# Patient Record
Sex: Female | Born: 1980 | Race: White | Hispanic: No | Marital: Married | Smoking: Never smoker
Health system: Southern US, Community
[De-identification: ages and names within clinical notes are randomized; demographics above are authoritative.]

## PROBLEM LIST (undated history)

## (undated) DIAGNOSIS — M7918 Myalgia, other site: Secondary | ICD-10-CM

## (undated) DIAGNOSIS — G43909 Migraine, unspecified, not intractable, without status migrainosus: Secondary | ICD-10-CM

## (undated) DIAGNOSIS — F419 Anxiety disorder, unspecified: Secondary | ICD-10-CM

## (undated) DIAGNOSIS — E538 Deficiency of other specified B group vitamins: Secondary | ICD-10-CM

## (undated) DIAGNOSIS — F988 Other specified behavioral and emotional disorders with onset usually occurring in childhood and adolescence: Secondary | ICD-10-CM

## (undated) DIAGNOSIS — G4700 Insomnia, unspecified: Secondary | ICD-10-CM

## (undated) HISTORY — PX: OTHER SURGICAL HISTORY: SHX169

## (undated) HISTORY — DX: Other specified behavioral and emotional disorders with onset usually occurring in childhood and adolescence: F98.8

## (undated) HISTORY — DX: Myalgia, other site: M79.18

## (undated) HISTORY — DX: Migraine, unspecified, not intractable, without status migrainosus: G43.909

## (undated) HISTORY — DX: Deficiency of other specified B group vitamins: E53.8

## (undated) HISTORY — DX: Anxiety disorder, unspecified: F41.9

## (undated) HISTORY — PX: DILATION AND CURETTAGE OF UTERUS: SHX78

## (undated) HISTORY — PX: WISDOM TOOTH EXTRACTION: SHX21

## (undated) HISTORY — DX: Insomnia, unspecified: G47.00

---
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
## 1999-05-12 ENCOUNTER — Inpatient Hospital Stay (HOSPITAL_COMMUNITY): Admission: AD | Admit: 1999-05-12 | Discharge: 1999-05-12 | Payer: Self-pay | Admitting: Obstetrics and Gynecology

## 1999-08-14 ENCOUNTER — Other Ambulatory Visit: Admission: RE | Admit: 1999-08-14 | Discharge: 1999-08-14 | Payer: Self-pay | Admitting: Obstetrics & Gynecology

## 2002-03-24 ENCOUNTER — Inpatient Hospital Stay (HOSPITAL_COMMUNITY): Admission: AD | Admit: 2002-03-24 | Discharge: 2002-03-24 | Payer: Self-pay | Admitting: Obstetrics & Gynecology

## 2009-04-28 HISTORY — PX: APPENDECTOMY: SHX54

## 2014-07-03 ENCOUNTER — Other Ambulatory Visit: Payer: Self-pay | Admitting: Family Medicine

## 2014-07-03 DIAGNOSIS — R599 Enlarged lymph nodes, unspecified: Secondary | ICD-10-CM

## 2014-07-26 ENCOUNTER — Ambulatory Visit
Admission: RE | Admit: 2014-07-26 | Discharge: 2014-07-26 | Disposition: A | Discharge status: Home / Self Care | Payer: 59 | Source: Ambulatory Visit | Attending: Family Medicine | Admitting: Family Medicine

## 2014-07-26 DIAGNOSIS — R599 Enlarged lymph nodes, unspecified: Secondary | ICD-10-CM

## 2014-08-04 ENCOUNTER — Encounter: Payer: Self-pay | Admitting: Neurology

## 2014-08-07 ENCOUNTER — Telehealth: Payer: Self-pay | Admitting: *Deleted

## 2014-08-07 NOTE — Telephone Encounter (Signed)
Patient canceled her new patient appointment for 4-12. She will reschedule soon. Patients referring provider notified (bobbi)

## 2014-08-08 ENCOUNTER — Ambulatory Visit: Payer: Self-pay | Admitting: Neurology

## 2016-02-05 ENCOUNTER — Other Ambulatory Visit (HOSPITAL_COMMUNITY)
Admission: RE | Admit: 2016-02-05 | Discharge: 2016-02-05 | Disposition: A | Discharge status: Home / Self Care | Payer: BLUE CROSS/BLUE SHIELD | Source: Ambulatory Visit | Attending: Obstetrics and Gynecology | Admitting: Obstetrics and Gynecology

## 2016-02-05 ENCOUNTER — Other Ambulatory Visit: Payer: Self-pay | Admitting: Obstetrics and Gynecology

## 2016-02-05 DIAGNOSIS — Z01419 Encounter for gynecological examination (general) (routine) without abnormal findings: Secondary | ICD-10-CM | POA: Insufficient documentation

## 2016-02-05 DIAGNOSIS — Z1151 Encounter for screening for human papillomavirus (HPV): Secondary | ICD-10-CM | POA: Insufficient documentation

## 2016-02-06 LAB — CYTOLOGY - PAP

## 2019-05-04 ENCOUNTER — Encounter (HOSPITAL_BASED_OUTPATIENT_CLINIC_OR_DEPARTMENT_OTHER): Payer: Self-pay | Admitting: *Deleted

## 2019-05-04 ENCOUNTER — Other Ambulatory Visit: Payer: Self-pay

## 2019-05-04 ENCOUNTER — Emergency Department (HOSPITAL_BASED_OUTPATIENT_CLINIC_OR_DEPARTMENT_OTHER)
Admission: EM | Admit: 2019-05-04 | Discharge: 2019-05-04 | Disposition: A | Discharge status: Home / Self Care | Payer: 59 | Attending: Emergency Medicine | Admitting: Emergency Medicine

## 2019-05-04 ENCOUNTER — Emergency Department (HOSPITAL_BASED_OUTPATIENT_CLINIC_OR_DEPARTMENT_OTHER): Payer: 59

## 2019-05-04 DIAGNOSIS — K5900 Constipation, unspecified: Secondary | ICD-10-CM | POA: Insufficient documentation

## 2019-05-04 DIAGNOSIS — Z5321 Procedure and treatment not carried out due to patient leaving prior to being seen by health care provider: Secondary | ICD-10-CM | POA: Diagnosis not present

## 2019-05-04 LAB — URINALYSIS, ROUTINE W REFLEX MICROSCOPIC
Bilirubin Urine: NEGATIVE
Glucose, UA: 100 mg/dL — AB
Ketones, ur: NEGATIVE mg/dL
Leukocytes,Ua: NEGATIVE
Nitrite: NEGATIVE
Protein, ur: NEGATIVE mg/dL
Specific Gravity, Urine: >1.03 — ABNORMAL HIGH (ref 1.005–1.030)
pH: 5.5 (ref 5.0–8.0)

## 2019-05-04 LAB — URINALYSIS, MICROSCOPIC (REFLEX)

## 2019-05-04 LAB — PREGNANCY, URINE: Preg Test, Ur: NEGATIVE

## 2019-05-04 MED ORDER — MAGNESIUM CITRATE PO SOLN
1.0000 | Freq: Once | ORAL | Status: AC
  Administered 2019-05-04: 1 via ORAL

## 2019-05-04 MED ORDER — MAGNESIUM CITRATE PO SOLN
ORAL | Status: AC
  Filled 2019-05-04: qty 296

## 2019-05-04 NOTE — ED Triage Notes (Addendum)
Pt c/o constipation x 3 days, taken oxi for wisdom teeth removal

## 2019-05-04 NOTE — ED Notes (Signed)
Pt reports having BM after medication. Pt requesting to leave.

## 2019-11-01 ENCOUNTER — Other Ambulatory Visit: Payer: Self-pay | Admitting: Obstetrics and Gynecology

## 2019-11-01 DIAGNOSIS — Z1231 Encounter for screening mammogram for malignant neoplasm of breast: Secondary | ICD-10-CM

## 2019-11-04 ENCOUNTER — Ambulatory Visit: Payer: 59

## 2020-03-12 ENCOUNTER — Ambulatory Visit: Payer: 59 | Admitting: Diagnostic Neuroimaging

## 2020-03-19 ENCOUNTER — Ambulatory Visit: Payer: 59 | Admitting: Neurology

## 2020-06-14 ENCOUNTER — Encounter: Payer: Self-pay | Admitting: *Deleted

## 2020-06-14 ENCOUNTER — Telehealth: Payer: Self-pay | Admitting: Neurology

## 2020-06-14 ENCOUNTER — Encounter: Payer: Self-pay | Admitting: Neurology

## 2020-06-14 ENCOUNTER — Ambulatory Visit (INDEPENDENT_AMBULATORY_CARE_PROVIDER_SITE_OTHER): Payer: 59 | Admitting: Neurology

## 2020-06-14 VITALS — BP 110/73 | HR 88 | Ht 68.0 in | Wt 117.0 lb

## 2020-06-14 DIAGNOSIS — G43711 Chronic migraine without aura, intractable, with status migrainosus: Secondary | ICD-10-CM | POA: Diagnosis not present

## 2020-06-14 DIAGNOSIS — H539 Unspecified visual disturbance: Secondary | ICD-10-CM

## 2020-06-14 DIAGNOSIS — R519 Headache, unspecified: Secondary | ICD-10-CM | POA: Diagnosis not present

## 2020-06-14 DIAGNOSIS — G8929 Other chronic pain: Secondary | ICD-10-CM

## 2020-06-14 DIAGNOSIS — R51 Headache with orthostatic component, not elsewhere classified: Secondary | ICD-10-CM | POA: Diagnosis not present

## 2020-06-14 MED ORDER — ATOGEPANT 60 MG PO TABS: 60.0000 mg | ORAL_TABLET | Freq: Every day | ORAL | 6 refills | Status: DC

## 2020-06-14 NOTE — Telephone Encounter (Signed)
Botox charge sheet completed and is pending MD signature. Dx code G43.711 

## 2020-06-14 NOTE — Patient Instructions (Addendum)
Start Qulipta preventative Initiate botox - will call  Cephaly trial - return in a month or so Nerivio - trial - will call Continue Bernita Raisin - will resend prescrip for 16 MRI brain w/wo contrast - will call Car glass tinting - will have to send to you F/u 3 months

## 2020-06-14 NOTE — Telephone Encounter (Signed)
Patient is transferring from the [facility] to our clinic. She is due for botox. I told her we would look into how fast we could get the botox auth changed to our practice. If it will take more than several weeks I can also use samples to get her by. Please let me know thanks

## 2020-06-14 NOTE — Progress Notes (Signed)
For headache management pt has tried Sumatriptan (side effects), Nurtec (did not help at all), Rizatriptan, Baclofen, Zomig, Gabapentin ("horrible medication"), Amitriptyline, Topamax, Paxil, Ajovy, Red light therapy, cupping, trigger point injections, massage (helps), Tylenol, Wellbutrin, has not tried beta blockers because her blood pressure was low.

## 2020-06-14 NOTE — Progress Notes (Signed)
[facility]    Provider:  Dr Lucia GaskinsAhern Requesting Provider: Farris HasMorrow, Aaron, MD Primary Care Provider:  Farris HasMorrow, Aaron, MD  CC:  migraines  HPI:  Emma Miller is a 40 y.o. female here as requested by Farris HasMorrow, Aaron, MD for migraines. PMHx migraine headaches, B12 deficiency, ADD, insomnia, anxiety.  I reviewed Dr. Felicity PellegriniMoreau's notes: Patient appears to be in rizatriptan and Bernita RaisinUbrelvy which are medications that are used in migraine management, also daily B12 (states not taking currently).  She [place] [facility] [place] and [facility]  Dr. Felicity PellegriniMoreau's examination which included head, eyes, neck, lungs, heart and, psych were all normal.  She has had them since HS, 20-25 days a month of migraines, she has 2 different kinds of migraines, she can get pain on the left side of the head in the temple area, and under her eye, she can wake up with it, waxes and wanes, worse aftre lunch and before bed, it is always throbbing and always there, she will only have a few days of no headaches a month, she has tried watching her food and her environmental triggers. She also has other pains her neck her back and her hand on the left, they hurt. She just wants to put pressure on the spots her whole left arm and left side of her neck. Also light/sound sensitivity, pulsating/pounding/throbbing, nausea but no vomiting, a heating pad on the left side of her head and muscles help. Worse positionally and vision changes, can wake with headaches.  Lights and sirens always bother, she has motion sensitivity and doesn't ride. She even moved recently because of the traffic. She gets a 50% improvement in severity with her migraines but not frequency. No other focal neurologic deficits, associated symptoms, inciting events or modifiable factors.   Reviewed following report from prior notes, [facility]:  Current and past  medications: ANALGESICS:aspirin, Excedrin, tylenol ANTI-MIGRAINE:imitrex (SE: malaise), Maxalt, zomig HEART/BP: metoprolol DECONGESTANT/ANTIHISTAMINE: allegra, benadryl, Claritin, Flonase, Nasonex, sudafed, zyrtec ANTI-NAUSEANT NSAIDS:ibuprofen, naproxen MUSCLE RELAXANTS: tizanidine ANTI-CONVULSANTS: topamax STEROIDS: SLEEPING PILLS/TRANQUILIZERS: Ambien, tylenol pm ANTI-DEPRESSANTS: Wellbutrin, Zoloft, amitriptyline, nortriptyline, paxil HERBAL: Magnesium FIBROMYALGIA:  HORMONAL: OTHER: Ajovy,  PROCEDURES FOR HEADACHES: Botox    Reviewed notes, labs and imaging from outside physicians, which showed: see above  Review of Systems: Patient complains of symptoms per HPI as well as the following symptoms: headaches. Pertinent negatives and positives per HPI. All others negative.   Social History   Socioeconomic History  . Marital status: Married    Spouse name: Not on file  . Number of children: 1  . Years of education: 1 yr beauty school  . Highest education level: Some college, no degree  Occupational History  . Not on file  Tobacco Use  . Smoking status: Never Smoker  . Smokeless tobacco: Never Used  Vaping Use  . Vaping Use: Never used  Substance and Sexual Activity  . Alcohol use: No    Alcohol/week: 0.0 standard drinks  . Drug use: No  . Sexual activity: Not on file  Other Topics Concern  . Not on file  Social History Narrative   Lives at home with spouse and child   Right handed   Caffeine: 1 cup/day   Social Determinants of Health   Financial Resource Strain: Not on file  Food Insecurity: Not on file  Transportation Needs: Not on file  Physical Activity: Not on file  Stress: Not on file  Social Connections:  Not on file  Intimate Partner Violence: Not on file    Family History  Problem Relation Age of Onset  . Diabetes Father   . Cervical cancer Mother   . Migraines Mother        had TMJ surgery which resolved them  . Migraines Daughter      Past Medical History:  Diagnosis Date  . ADD (attention deficit disorder)   . Anxiety   . B12 deficiency   . Insomnia   . Migraine   . Myofascial pain syndrome     Patient Active Problem List   Diagnosis Date Noted  . Chronic migraine without aura, with intractable migraine, so stated, with status migrainosus 06/16/2020    Past Surgical History:  Procedure Laterality Date  . APPENDECTOMY  2011  . BARTHOLIN GLAND CYST    . DILATION AND CURETTAGE OF UTERUS    . WISDOM TOOTH EXTRACTION      Current Outpatient Medications  Medication Sig Dispense Refill  . ADDERALL XR 30 MG 24 hr capsule Take 30 mg by mouth every morning.  0  . amphetamine-dextroamphetamine (ADDERALL) 20 MG tablet Take 20 mg by mouth. afternoon    . Atogepant 60 MG TABS Take 60 mg by mouth daily. 30 tablet 6  . baclofen (LIORESAL) 10 MG tablet Take 10 mg by mouth at bedtime as needed.    . Cyanocobalamin (VITAMIN B-12 PO) Take by mouth.    . fexofenadine (ALLEGRA) 180 MG tablet Take 180 mg by mouth daily.    . fluticasone (FLONASE) 50 MCG/ACT nasal spray Place into the nose.    . norethindrone-ethinyl estradiol (JUNEL FE,GILDESS FE,LOESTRIN FE) 1-20 MG-MCG tablet Take 1 tablet by mouth.    . OnabotulinumtoxinA (BOTOX IJ) Inject as directed every 3 (three) months.    . rizatriptan (MAXALT) 10 MG tablet Take by mouth.    . UBRELVY 100 MG TABS SMARTSIG:1 Tablet(s) By Mouth Every 2 Hours PRN    . Ubrogepant (UBRELVY) 100 MG TABS Take 100 mg by mouth every 2 (two) hours as needed. Maximum 200mg  a day. 16 tablet 11  . zolpidem (AMBIEN) 5 MG tablet Take 5 mg by mouth at bedtime.    . SUMAtriptan (IMITREX) 100 MG tablet TAKE 1 TABLET (100 MG TOTAL) BY MOUTH EVERY 2 (TWO) HOURS AS NEEDED FOR MIGRAINE. (Patient not taking: Reported on 06/14/2020)     No current facility-administered medications for this visit.    Allergies as of 06/14/2020 - Review Complete 06/14/2020  Allergen Reaction Noted  . Doxycycline Other  (See Comments) 08/04/2014  . Nsaids Swelling 06/14/2020    Vitals: BP 110/73 (BP Location: Left Arm, Patient Position: Sitting)   Pulse 88   Ht 5\' 8"  (1.727 m)   Wt 117 lb (53.1 kg)   BMI 17.79 kg/m  Last Weight:  Wt Readings from Last 1 Encounters:  06/14/20 117 lb (53.1 kg)   Last Height:   Ht Readings from Last 1 Encounters:  06/14/20 5\' 8"  (1.727 m)     Physical exam: Exam: Gen: NAD, conversant, well nourised, well groomed                     CV: RRR, no MRG. No Carotid Bruits. No peripheral edema, warm, nontender Eyes: Conjunctivae clear without exudates or hemorrhage  Neuro: Detailed Neurologic Exam  Speech:    Speech is normal; fluent and spontaneous with normal comprehension.  Cognition:    The patient is oriented to person, place, and  time;     recent and remote memory intact;     language fluent;     normal attention, concentration,     fund of knowledge Cranial Nerves:    The pupils are equal, round, and reactive to light. The fundi are normal and spontaneous venous pulsations are present. Visual fields are full to finger confrontation. Extraocular movements are intact. Trigeminal sensation is intact and the muscles of mastication are normal. The face is symmetric. The palate elevates in the midline. Hearing intact. Voice is normal. Shoulder shrug is normal. The tongue has normal motion without fasciculations.   Coordination:  no dysmetria or ataxia  Gait:    Normal native gait  Motor Observation:    No asymmetry, no atrophy, and no involuntary movements noted. Tone:    Normal muscle tone.    Posture:    Posture is normal. normal erect    Strength:    Strength is V/V in the upper and lower limbs.      Sensation: intact to LT     Reflex Exam:  DTR's:    Deep tendon reflexes in the upper and lower extremities are normal bilaterally.   Toes:    The toes are downgoing bilaterally.   Clonus:    Clonus is absent.    Assessment/Plan:  Patient  with chronic intractable headaches and migraines. She has failed multiple medications. Botox providing >50% relief in severity but still having daily and for the most part continuous headaches with concerning symptoms needs a thorough evaluation.  - Start Qulipta preventative - Initiate botox(she has been getting it from [place], due now) - Not in the cervical paraspinals. Add more at left temple more where the migraines are the worst - Cephaly trial, let her borrow our cephaly, she will bring it back in 4 weeks - Nerivio - prescribe - Continue Bernita Raisin - will send in script for 16 - MRI brain: MRI brain due to concerning symptoms of morning headaches, positional headaches,vision changes  to look for space occupying mass, chiari or intracranial hypertension (pseudotumor). - Car glass tinting - will have to send to you   Orders Placed This Encounter  Procedures  . MR BRAIN W WO CONTRAST   Meds ordered this encounter  Medications  . Atogepant 60 MG TABS    Sig: Take 60 mg by mouth daily.    Dispense:  30 tablet    Refill:  6  . Ubrogepant (UBRELVY) 100 MG TABS    Sig: Take 100 mg by mouth every 2 (two) hours as needed. Maximum 200mg  a day.    Dispense:  16 tablet    Refill:  11   Discussed: To prevent or relieve headaches, try the following: Cool Compress. Lie down and place a cool compress on your head.  Avoid headache triggers. If certain foods or odors seem to have triggered your migraines in the past, avoid them. A headache diary might help you identify triggers.  Include physical activity in your daily routine. Try a daily walk or other moderate aerobic exercise.  Manage stress. Find healthy ways to cope with the stressors, such as delegating tasks on your to-do list.  Practice relaxation techniques. Try deep breathing, yoga, massage and visualization.  Eat regularly. Eating regularly scheduled meals and maintaining a healthy diet might help prevent headaches. Also, drink plenty of  fluids.  Follow a regular sleep schedule. Sleep deprivation might contribute to headaches Consider biofeedback. With this mind-body technique, you learn to control certain bodily functions --  such as muscle tension, heart rate and blood pressure -- to prevent headaches or reduce headache pain.    Proceed to emergency room if you experience new or worsening symptoms or symptoms do not resolve, if you have new neurologic symptoms or if headache is severe, or for any concerning symptom.   Provided education and documentation from American headache Society toolbox including articles on: chronic migraine medication overuse headache, chronic migraines, prevention of migraines, behavioral and other nonpharmacologic treatments for headache.   Cc: Farris Has, MD,  Farris Has, MD  Naomie Dean, MD  [facility] [address] [place], [place] [address]  Phone [phone] Fax [phone]

## 2020-06-16 ENCOUNTER — Encounter: Payer: Self-pay | Admitting: Neurology

## 2020-06-16 DIAGNOSIS — G43711 Chronic migraine without aura, intractable, with status migrainosus: Secondary | ICD-10-CM | POA: Insufficient documentation

## 2020-06-16 MED ORDER — UBRELVY 100 MG PO TABS: 100.0000 mg | ORAL_TABLET | ORAL | 11 refills | Status: DC | PRN

## 2020-06-18 NOTE — Telephone Encounter (Signed)
Let's set her up for next week if we have any openings? I can always use samples if needed.

## 2020-06-18 NOTE — Telephone Encounter (Signed)
Filled out [facility] authorization request form for Botox to be administered at our office. Faxed with notes. I should receive an approval this week, but if I don't I will let you know.

## 2020-06-19 ENCOUNTER — Telehealth: Payer: Self-pay | Admitting: Neurology

## 2020-06-19 ENCOUNTER — Telehealth: Payer: Self-pay | Admitting: *Deleted

## 2020-06-19 NOTE — Telephone Encounter (Signed)
Received message via fax from [place]. Patient has qualified to receive Qulipta at no cost, through the [place] complete program at this time. They will reach out to patient and will schedule delivery.

## 2020-06-19 NOTE — Telephone Encounter (Signed)
[facility]: [id number] (exp. 06/19/20 to 07/18/20) order sent to [facility]. [facility] is not in network for the patient MRI they will reach out to the patient to schedule.

## 2020-06-19 NOTE — Telephone Encounter (Signed)
How about Monday at 330?

## 2020-06-19 NOTE — Telephone Encounter (Signed)
I called the patient this afternoon but was not able to get in touch with her or LVM. I will try again tomorrow.

## 2020-06-19 NOTE — Telephone Encounter (Signed)
I had to add it on the schedule but it is there and held.

## 2020-06-19 NOTE — Telephone Encounter (Signed)
We do not currently have any openings but I will help watch for one.

## 2020-06-19 NOTE — Telephone Encounter (Deleted)
Emma Miller we could do Monday 2/28 @ 330.

## 2020-06-19 NOTE — Telephone Encounter (Signed)
Bethany please block Monday at 330 so taylor can offer it to her, I'll try and send her an email as well thanks

## 2020-06-19 NOTE — Telephone Encounter (Signed)
Qulipta 60 mg order form completed, signed, and faxed to [place]. Received a receipt of confirmation.

## 2020-06-19 NOTE — Telephone Encounter (Signed)
Received approval from [facility]. PA #[id number] (06/18/20- 09/15/20). Servicing facility/provider changed to [facility]/Dr. Lucia Gaskins.

## 2020-06-20 NOTE — Telephone Encounter (Signed)
Absolutely, use samples thanks!

## 2020-06-20 NOTE — Telephone Encounter (Signed)
I called the patient. She is happy to come in Monday. I placed her on the schedule. Patient's insurance requires use of specialty pharmacy and I will work on getting that set up. It is okay to use samples in the meantime. [id number] is approved and ok to bill for.

## 2020-06-20 NOTE — Telephone Encounter (Signed)
Filled out prescription enrollment form for [facility]. Gave to MD to sign.

## 2020-06-21 NOTE — Telephone Encounter (Signed)
Received signed prescription form. Faxed to [facility].

## 2020-06-25 ENCOUNTER — Ambulatory Visit (INDEPENDENT_AMBULATORY_CARE_PROVIDER_SITE_OTHER): Payer: 59 | Admitting: Neurology

## 2020-06-25 ENCOUNTER — Other Ambulatory Visit: Payer: Self-pay

## 2020-06-25 DIAGNOSIS — G43711 Chronic migraine without aura, intractable, with status migrainosus: Secondary | ICD-10-CM

## 2020-06-25 NOTE — Progress Notes (Signed)
Botox consent signed  Botox- 200 units x 1 vial Lot: [id number] Expiration: 06/2022 NDC: 5631-4970-26  Bacteriostatic 0.9% Sodium Chloride- 63mL total Lot: [id number] Expiration: 05/29/2021 NDC: 5027-7412-87  Dx: G43.711 sample

## 2020-06-25 NOTE — Progress Notes (Signed)
Consent Form Botulism Toxin Injection For Chronic Migraine  06/25/2020: This is our first botox (she has been receiving it at another headache clinic). See pictures from this date for placement. +a. No Traps or cervical paraspinals, she feels it makes her neck weak. Extra right temporalis where migraines start.   I spent 30 minutes of face-to-face and non-face-to-face time with patient on the  1. Chronic migraine without aura, with intractable migraine, so stated, with status migrainosus    diagnosis.  This included previsit chart review, lab review, study review, order entry, electronic health record documentation, patient education on the different diagnostic and therapeutic options, counseling and coordination of care, risks and benefits of management, compliance, or risk factor reduction  Reviewed orally with patient, additionally signature is on file:  Botulism toxin has been approved by the Federal drug administration for treatment of chronic migraine. Botulism toxin does not cure chronic migraine and it may not be effective in some patients.  The administration of botulism toxin is accomplished by injecting a small amount of toxin into the muscles of the neck and head. Dosage must be titrated for each individual. Any benefits resulting from botulism toxin tend to wear off after 3 months with a repeat injection required if benefit is to be maintained. Injections are usually done every 3-4 months with maximum effect peak achieved by about 2 or 3 weeks. Botulism toxin is expensive and you should be sure of what costs you will incur resulting from the injection.  The side effects of botulism toxin use for chronic migraine may include:   -Transient, and usually mild, facial weakness with facial injections  -Transient, and usually mild, head or neck weakness with head/neck injections  -Reduction or loss of forehead facial animation due to forehead muscle weakness  -Eyelid drooping  -Dry  eye  -Pain at the site of injection or bruising at the site of injection  -Double vision  -Potential unknown long term risks  Contraindications: You should not have Botox if you are pregnant, nursing, allergic to albumin, have an infection, skin condition, or muscle weakness at the site of the injection, or have myasthenia gravis, Lambert-Eaton syndrome, or ALS.  It is also possible that as with any injection, there may be an allergic reaction or no effect from the medication. Reduced effectiveness after repeated injections is sometimes seen and rarely infection at the injection site may occur. All care will be taken to prevent these side effects. If therapy is given over a long time, atrophy and wasting in the muscle injected may occur. Occasionally the patient's become refractory to treatment because they develop antibodies to the toxin. In this event, therapy needs to be modified.  I have read the above information and consent to the administration of botulism toxin.    BOTOX PROCEDURE NOTE FOR MIGRAINE HEADACHE    Contraindications and precautions discussed with patient(above). Aseptic procedure was observed and patient tolerated procedure. Procedure performed by Dr. Artemio Aly  The condition has existed for more than 6 months, and pt does not have a diagnosis of ALS, Myasthenia Gravis or Lambert-Eaton Syndrome.  Risks and benefits of injections discussed and pt agrees to proceed with the procedure.  Written consent obtained  These injections are medically necessary. Pt  receives good benefits from these injections. These injections do not cause sedations or hallucinations which the oral therapies may cause.  Description of procedure:  The patient was placed in a sitting position. The standard protocol was used for Botox as follows,  with 5 units of Botox injected at each site:   -Procerus muscle, midline injection  -Corrugator muscle, bilateral injection  -Frontalis muscle,  bilateral injection, with 2 sites each side, medial injection was performed in the upper one third of the frontalis muscle, in the region vertical from the medial inferior edge of the superior orbital rim. The lateral injection was again in the upper one third of the forehead vertically above the lateral limbus of the cornea, 1.5 cm lateral to the medial injection site.  -Temporalis muscle injection, 4 sites, bilaterally. The first injection was 3 cm above the tragus of the ear, second injection site was 1.5 cm to 3 cm up from the first injection site in line with the tragus of the ear. The third injection site was 1.5-3 cm forward between the first 2 injection sites. The fourth injection site was 1.5 cm posterior to the second injection site.   -Occipitalis muscle injection, 3 sites, bilaterally. The first injection was done one half way between the occipital protuberance and the tip of the mastoid process behind the ear. The second injection site was done lateral and superior to the first, 1 fingerbreadth from the first injection. The third injection site was 1 fingerbreadth superiorly and medially from the first injection site.  -Cervical paraspinal muscle injection, 2 sites, bilateral knee first injection site was 1 cm from the midline of the cervical spine, 3 cm inferior to the lower border of the occipital protuberance. The second injection site was 1.5 cm superiorly and laterally to the first injection site.  -Trapezius muscle injection was performed at 3 sites, bilaterally. The first injection site was in the upper trapezius muscle halfway between the inflection point of the neck, and the acromion. The second injection site was one half way between the acromion and the first injection site. The third injection was done between the first injection site and the inflection point of the neck.   Will return for repeat injection in 3 months.   200 units of Botox was used, any Botox not injected was  wasted. The patient tolerated the procedure well, there were no complications of the above procedure.

## 2020-07-02 ENCOUNTER — Other Ambulatory Visit: Payer: Self-pay | Admitting: Neurology

## 2020-07-02 NOTE — Telephone Encounter (Signed)
Completed [employer] PA request for pharmacy benefit via CMM. Waiting for response.

## 2020-07-02 NOTE — Telephone Encounter (Signed)
I called the patient. Advised that for May and future Botox injections we will use [facility]. Advised patient that prescription was faxed 2/24 and they are processing prior authorization at this time. Advised patient that I will continue to check status and she will be notified when it is time to schedule Botox delivery to our office. Patient verbalized understanding.

## 2020-07-02 NOTE — Telephone Encounter (Signed)
I added that pharmacy to her list. In the future we can send there. If she calls her current [facility] they can transfer all the prescriptions for her thanks.

## 2020-07-04 ENCOUNTER — Telehealth: Payer: Self-pay | Admitting: *Deleted

## 2020-07-04 NOTE — Telephone Encounter (Signed)
Completed Nerivio order form and printed office note and insurance/demographic information. Order form is pending Dr Trevor Mace signature.

## 2020-07-04 NOTE — Telephone Encounter (Signed)
I called [facility] and spoke with Aundra Millet today to check status of Botox order. Aundra Millet states the order is ready but they need patient's consent. I called patient and let her know. I also advised patient of Dr. Trevor Mace message regarding switching pharmacies.

## 2020-07-04 NOTE — Telephone Encounter (Signed)
Nerivio order form signed by Dr Lucia Gaskins, faxed to [facility]. Received a receipt of confirmation.

## 2020-07-06 ENCOUNTER — Ambulatory Visit
Admission: RE | Admit: 2020-07-06 | Discharge: 2020-07-06 | Disposition: A | Discharge status: Home / Self Care | Payer: 59 | Source: Ambulatory Visit | Attending: Neurology | Admitting: Neurology

## 2020-07-06 DIAGNOSIS — H539 Unspecified visual disturbance: Secondary | ICD-10-CM

## 2020-07-06 DIAGNOSIS — R519 Headache, unspecified: Secondary | ICD-10-CM

## 2020-07-06 DIAGNOSIS — R51 Headache with orthostatic component, not elsewhere classified: Secondary | ICD-10-CM

## 2020-07-06 DIAGNOSIS — G43711 Chronic migraine without aura, intractable, with status migrainosus: Secondary | ICD-10-CM

## 2020-07-06 MED ORDER — GADOBENATE DIMEGLUMINE 529 MG/ML IV SOLN
10.0000 mL | Freq: Once | INTRAVENOUS | Status: AC | PRN
  Administered 2020-07-06: 10 mL via INTRAVENOUS

## 2020-07-09 NOTE — Telephone Encounter (Signed)
I called the specialty pharmacy this morning and spoke with Patrona to check order status. She states the patient has not yet given consent. I placed her on hold and attempted to call the patient. The patient was not available and VM is full.

## 2020-07-12 ENCOUNTER — Telehealth: Payer: Self-pay | Admitting: *Deleted

## 2020-07-12 NOTE — Telephone Encounter (Signed)
Bernita Raisin PA form signed by MD, then faxed to [employer]. Received a receipt of confirmation.

## 2020-07-12 NOTE — Telephone Encounter (Signed)
Received fax request for PA. PA filled out, awaiting MD signature and then will fax in for review

## 2020-08-06 NOTE — Telephone Encounter (Signed)
Received a call from [facility]. Botox TBD on 4/19 for 5/31 appointment.

## 2020-08-08 ENCOUNTER — Ambulatory Visit: Payer: 59

## 2020-08-09 ENCOUNTER — Inpatient Hospital Stay: Admission: RE | Admit: 2020-08-09 | Payer: 59 | Source: Ambulatory Visit

## 2020-08-14 ENCOUNTER — Other Ambulatory Visit: Payer: Self-pay | Admitting: Neurology

## 2020-08-14 NOTE — Telephone Encounter (Signed)
Rceived (1) 200 unit vial of Botox today from [facility].

## 2020-08-15 ENCOUNTER — Encounter: Payer: Self-pay | Admitting: *Deleted

## 2020-08-15 ENCOUNTER — Telehealth: Payer: Self-pay

## 2020-08-15 NOTE — Telephone Encounter (Signed)
I have submitted PA request for Ubrelvy 100mg  on [facility], [place]: [facility].  Awaiting determination from [employer]

## 2020-08-15 NOTE — Telephone Encounter (Signed)
Received fax stating Emma Miller has been approved from 08/15/2020 to 08/14/2021.  I faxed this notice to the patient's pharmacy. Received a receipt of confirmation.

## 2020-08-27 ENCOUNTER — Telehealth: Payer: Self-pay | Admitting: Neurology

## 2020-08-27 NOTE — Telephone Encounter (Signed)
Patient's next Botox appointment is 5/31. Her PA on file with [facility] will expire on 5/21. I filled out new PA form and faxed to [facility] with notes.

## 2020-09-13 NOTE — Telephone Encounter (Signed)
Received approval from [facility] for [id number] (09/15/20- 12/16/20)  Approval for [id number] is through [employer] (pharmacy benefit). PA #[id number] (07/03/20- 07/02/21). [employer] phone: [phone].

## 2020-09-25 ENCOUNTER — Ambulatory Visit (INDEPENDENT_AMBULATORY_CARE_PROVIDER_SITE_OTHER): Payer: 59 | Admitting: Neurology

## 2020-09-25 DIAGNOSIS — G43711 Chronic migraine without aura, intractable, with status migrainosus: Secondary | ICD-10-CM | POA: Diagnosis not present

## 2020-09-25 MED ORDER — REYVOW 100 MG PO TABS: 100.0000 mg | ORAL_TABLET | Freq: Once | ORAL | 0 refills | Status: AC | PRN

## 2020-09-25 MED ORDER — ZEMBRACE SYMTOUCH 3 MG/0.5ML ~~LOC~~ SOAJ: 3.0000 mg | Freq: Once | SUBCUTANEOUS | 0 refills | Status: AC | PRN

## 2020-09-25 MED ORDER — QULIPTA 60 MG PO TABS: 60.0000 mg | ORAL_TABLET | Freq: Every day | ORAL | 0 refills | Status: DC

## 2020-09-25 MED ORDER — TOSYMRA 10 MG/ACT NA SOLN: 1.0000 | NASAL | 0 refills | Status: AC

## 2020-09-25 NOTE — Progress Notes (Signed)
Botox- 100 units x 2 vials Lot: [id number] Expiration: 01/2023 NDC: 1423-9532-02  Bacteriostatic 0.9% Sodium Chloride- 29mL total Lot: [id number] Expiration: 09/26/2021 NDC: 8616-8372-90  Dx: S11.155 SP

## 2020-09-25 NOTE — Progress Notes (Addendum)
Consent Form Botulism Toxin Injection For Chronic Migraine 09/25/2020: second botox. She did not feel the masseters helped. Look at the pictures from this date.  No traps or cervical muscles. Also gace her som samples to try. Do not take triptans more than 2x a day or mix triptans. She is allergic to nsaids cannot try cambia. Try to get eletriptan approved.  Meds ordered this encounter  Medications  . Atogepant (QULIPTA) 60 MG TABS    Sig: Take 60 mg by mouth daily.    Dispense:  30 tablet    Refill:  0  . SUMAtriptan (TOSYMRA) 10 MG/ACT SOLN    Sig: Place 1 spray into the nose every 1 hour x 3 doses for 3 doses.    Dispense:  1 each    Refill:  0  . SUMAtriptan Succinate (ZEMBRACE SYMTOUCH) 3 MG/0.5ML SOAJ    Sig: Inject 3 mg into the skin once as needed for up to 1 dose. May repeat in 15 minutes. If symptoms persist, repeat in 2 hours. Max 4 injections daily.    Dispense:  0.5 mL    Refill:  0  . Lasmiditan Succinate (REYVOW) 100 MG TABS    Sig: Take 100 mg by mouth once as needed for up to 1 dose.    Dispense:  1 tablet    Refill:  0  . eletriptan (RELPAX) 40 MG tablet    Sig: Take 1 tablet (40 mg total) by mouth every 2 (two) hours as needed for migraine or headache. Maximum 2 doses in one day.    Dispense:  9 tablet    Refill:  11    06/25/2020: This is our first botox (she has been receiving it at another headache clinic). See pictures from this date for placement. +a. No Traps or cervical paraspinals, she feels it makes her neck weak. Extra right temporalis where migraines start.   I spent 30 minutes of face-to-face and non-face-to-face time with patient on the  1. Chronic migraine without aura, with intractable migraine, so stated, with status migrainosus    diagnosis.  This included previsit chart review, lab review, study review, order entry, electronic health record documentation, patient education on the different diagnostic and therapeutic options, counseling and  coordination of care, risks and benefits of management, compliance, or risk factor reduction  Reviewed orally with patient, additionally signature is on file:  Botulism toxin has been approved by the Federal drug administration for treatment of chronic migraine. Botulism toxin does not cure chronic migraine and it may not be effective in some patients.  The administration of botulism toxin is accomplished by injecting a small amount of toxin into the muscles of the neck and head. Dosage must be titrated for each individual. Any benefits resulting from botulism toxin tend to wear off after 3 months with a repeat injection required if benefit is to be maintained. Injections are usually done every 3-4 months with maximum effect peak achieved by about 2 or 3 weeks. Botulism toxin is expensive and you should be sure of what costs you will incur resulting from the injection.  The side effects of botulism toxin use for chronic migraine may include:   -Transient, and usually mild, facial weakness with facial injections  -Transient, and usually mild, head or neck weakness with head/neck injections  -Reduction or loss of forehead facial animation due to forehead muscle weakness  -Eyelid drooping  -Dry eye  -Pain at the site of injection or bruising at the site of  injection  -Double vision  -Potential unknown long term risks  Contraindications: You should not have Botox if you are pregnant, nursing, allergic to albumin, have an infection, skin condition, or muscle weakness at the site of the injection, or have myasthenia gravis, Lambert-Eaton syndrome, or ALS.  It is also possible that as with any injection, there may be an allergic reaction or no effect from the medication. Reduced effectiveness after repeated injections is sometimes seen and rarely infection at the injection site may occur. All care will be taken to prevent these side effects. If therapy is given over a long time, atrophy and wasting in  the muscle injected may occur. Occasionally the patient's become refractory to treatment because they develop antibodies to the toxin. In this event, therapy needs to be modified.  I have read the above information and consent to the administration of botulism toxin.    BOTOX PROCEDURE NOTE FOR MIGRAINE HEADACHE    Contraindications and precautions discussed with patient(above). Aseptic procedure was observed and patient tolerated procedure. Procedure performed by Dr. Artemio Aly  The condition has existed for more than 6 months, and pt does not have a diagnosis of ALS, Myasthenia Gravis or Lambert-Eaton Syndrome.  Risks and benefits of injections discussed and pt agrees to proceed with the procedure.  Written consent obtained  These injections are medically necessary. Pt  receives good benefits from these injections. These injections do not cause sedations or hallucinations which the oral therapies may cause.  Description of procedure:  The patient was placed in a sitting position. The standard protocol was used for Botox as follows, with 5 units of Botox injected at each site:   -Procerus muscle, midline injection  -Corrugator muscle, bilateral injection  -Frontalis muscle, bilateral injection, with 2 sites each side, medial injection was performed in the upper one third of the frontalis muscle, in the region vertical from the medial inferior edge of the superior orbital rim. The lateral injection was again in the upper one third of the forehead vertically above the lateral limbus of the cornea, 1.5 cm lateral to the medial injection site.  -Temporalis muscle injection, 4 sites, bilaterally. The first injection was 3 cm above the tragus of the ear, second injection site was 1.5 cm to 3 cm up from the first injection site in line with the tragus of the ear. The third injection site was 1.5-3 cm forward between the first 2 injection sites. The fourth injection site was 1.5 cm posterior to  the second injection site.   -Occipitalis muscle injection, 3 sites, bilaterally. The first injection was done one half way between the occipital protuberance and the tip of the mastoid process behind the ear. The second injection site was done lateral and superior to the first, 1 fingerbreadth from the first injection. The third injection site was 1 fingerbreadth superiorly and medially from the first injection site.  -Cervical paraspinal muscle injection, 2 sites, bilateral knee first injection site was 1 cm from the midline of the cervical spine, 3 cm inferior to the lower border of the occipital protuberance. The second injection site was 1.5 cm superiorly and laterally to the first injection site.  -Trapezius muscle injection was performed at 3 sites, bilaterally. The first injection site was in the upper trapezius muscle halfway between the inflection point of the neck, and the acromion. The second injection site was one half way between the acromion and the first injection site. The third injection was done between the first injection site  and the inflection point of the neck.   Will return for repeat injection in 3 months.   200 units of Botox was used, any Botox not injected was wasted. The patient tolerated the procedure well, there were no complications of the above procedure.

## 2020-09-26 ENCOUNTER — Ambulatory Visit: Payer: 59 | Admitting: Neurology

## 2020-09-26 MED ORDER — ELETRIPTAN HYDROBROMIDE 40 MG PO TABS: 40.0000 mg | ORAL_TABLET | ORAL | 11 refills | Status: DC | PRN

## 2020-09-26 NOTE — Addendum Note (Signed)
Addended by: Naomie Dean B on: 09/26/2020 05:45 PM   Modules accepted: Orders

## 2020-11-13 ENCOUNTER — Telehealth: Payer: Self-pay | Admitting: *Deleted

## 2020-11-13 NOTE — Telephone Encounter (Signed)
Received fax re: Bennie Pierini PA needed. [phone].

## 2020-11-14 ENCOUNTER — Telehealth: Payer: Self-pay | Admitting: *Deleted

## 2020-11-14 NOTE — Telephone Encounter (Signed)
Initiated PA for quilipta 60mg  po daily Key: [id number] - PA Case ID: [id number]. Determination pending from [employer].

## 2020-11-15 NOTE — Telephone Encounter (Signed)
Denied.  Fax confirmation received to [place] [phone].  [employer].

## 2020-11-21 ENCOUNTER — Telehealth: Payer: Self-pay | Admitting: Neurology

## 2020-11-21 NOTE — Telephone Encounter (Signed)
Derek Degroot @ [facility] is asking for a call from [place] re: the pa for pt he can be reached at [phone]

## 2020-11-21 NOTE — Telephone Encounter (Signed)
LMVM for Emma Miller, with qulipta that did send denial PA relating to pt , can redo again if needed.   He is to call back if needed.

## 2020-11-22 NOTE — Telephone Encounter (Signed)
Called Emma Miller with [employer].   For denials on qulipta it will require appeal (either by form or calling [facility] to let them know in process).  This was something that was always needed but not enforced, but they are now being more strict.  FYI.  Will need to be done for this pt.

## 2020-11-22 NOTE — Telephone Encounter (Signed)
Derek Degroot @ [facility] called needing to speak to [place], RN to discuss the pt. Please advise.

## 2020-11-27 ENCOUNTER — Encounter: Payer: Self-pay | Admitting: *Deleted

## 2020-12-04 NOTE — Telephone Encounter (Signed)
Appeal signed by Dr Lucia Gaskins, then faxed to [employer]. Received a receipt of confirmation.

## 2020-12-10 ENCOUNTER — Telehealth: Payer: Self-pay | Admitting: Neurology

## 2020-12-10 NOTE — Telephone Encounter (Signed)
Patient has a Botox appointment 8/30. PA through [facility] for CPT [id number] will expire 8/21. I filled out new PA request form and faxed to [facility] with notes.   Patient has PA on file for [phone] through pharmacy ([employer]). PA reference #[id number] (07/03/20- 07/02/21).  I called [facility] [place] I spoke with [facility] who states patient has not given consent for shipment.

## 2020-12-11 NOTE — Telephone Encounter (Signed)
Received approval from [facility] for CPT [phone]. PA #[id number] (12/16/20- 03/18/21).  I called patient and let her know to call the specialty pharmacy. Provided her with the number to call.

## 2020-12-12 NOTE — Telephone Encounter (Signed)
I called [facility] [phone]) and spoke with Marena Chancy. Botox TBD 8/23.

## 2020-12-18 NOTE — Telephone Encounter (Signed)
Received (1) 200 unit vial of Botox today from [facility].

## 2020-12-25 ENCOUNTER — Ambulatory Visit (INDEPENDENT_AMBULATORY_CARE_PROVIDER_SITE_OTHER): Payer: 59 | Admitting: Neurology

## 2020-12-25 DIAGNOSIS — G43711 Chronic migraine without aura, intractable, with status migrainosus: Secondary | ICD-10-CM

## 2020-12-25 NOTE — Progress Notes (Signed)
Botox- 200 units x 1 vial Lot: [id number] Expiration: 04/2023 NDC: 0023-3921-02  Bacteriostatic 0.9% Sodium Chloride- 4mL total Lot: [id number] Expiration: 04/28/2022 NDC: 0409-1966-02  Dx: G43.711 S/P  

## 2020-12-25 NOTE — Progress Notes (Signed)
Consent Form Botulism Toxin Injection For Chronic Migraine 12/25/2020: Stable doing great. Third botox. 09/25/2020: second botox. She did not feel the masseters helped. Look at the pictures from this date.  No traps or cervical muscles. Also gace her som samples to try. Do not take triptans more than 2x a day or mix triptans. She is allergic to nsaids cannot try cambia. Try to get eletriptan approved.  No orders of the defined types were placed in this encounter.   06/25/2020: This is our first botox (she has been receiving it at another headache clinic). See pictures from this date for placement. +a. No Traps or cervical paraspinals, she feels it makes her neck weak. Extra right temporalis where migraines start.   I spent 30 minutes of face-to-face and non-face-to-face time with patient on the  No diagnosis found.  diagnosis.  This included previsit chart review, lab review, study review, order entry, electronic health record documentation, patient education on the different diagnostic and therapeutic options, counseling and coordination of care, risks and benefits of management, compliance, or risk factor reduction  Reviewed orally with patient, additionally signature is on file:  Botulism toxin has been approved by the Federal drug administration for treatment of chronic migraine. Botulism toxin does not cure chronic migraine and it may not be effective in some patients.  The administration of botulism toxin is accomplished by injecting a small amount of toxin into the muscles of the neck and head. Dosage must be titrated for each individual. Any benefits resulting from botulism toxin tend to wear off after 3 months with a repeat injection required if benefit is to be maintained. Injections are usually done every 3-4 months with maximum effect peak achieved by about 2 or 3 weeks. Botulism toxin is expensive and you should be sure of what costs you will incur resulting from the injection.  The  side effects of botulism toxin use for chronic migraine may include:   -Transient, and usually mild, facial weakness with facial injections  -Transient, and usually mild, head or neck weakness with head/neck injections  -Reduction or loss of forehead facial animation due to forehead muscle weakness  -Eyelid drooping  -Dry eye  -Pain at the site of injection or bruising at the site of injection  -Double vision  -Potential unknown long term risks  Contraindications: You should not have Botox if you are pregnant, nursing, allergic to albumin, have an infection, skin condition, or muscle weakness at the site of the injection, or have myasthenia gravis, Lambert-Eaton syndrome, or ALS.  It is also possible that as with any injection, there may be an allergic reaction or no effect from the medication. Reduced effectiveness after repeated injections is sometimes seen and rarely infection at the injection site may occur. All care will be taken to prevent these side effects. If therapy is given over a long time, atrophy and wasting in the muscle injected may occur. Occasionally the patient's become refractory to treatment because they develop antibodies to the toxin. In this event, therapy needs to be modified.  I have read the above information and consent to the administration of botulism toxin.    BOTOX PROCEDURE NOTE FOR MIGRAINE HEADACHE    Contraindications and precautions discussed with patient(above). Aseptic procedure was observed and patient tolerated procedure. Procedure performed by Dr. Artemio Aly  The condition has existed for more than 6 months, and pt does not have a diagnosis of ALS, Myasthenia Gravis or Lambert-Eaton Syndrome.  Risks and benefits of injections discussed  and pt agrees to proceed with the procedure.  Written consent obtained  These injections are medically necessary. Pt  receives good benefits from these injections. These injections do not cause sedations or  hallucinations which the oral therapies may cause.  Description of procedure:  The patient was placed in a sitting position. The standard protocol was used for Botox as follows, with 5 units of Botox injected at each site:   -Procerus muscle, midline injection  -Corrugator muscle, bilateral injection  -Frontalis muscle, bilateral injection, with 2 sites each side, medial injection was performed in the upper one third of the frontalis muscle, in the region vertical from the medial inferior edge of the superior orbital rim. The lateral injection was again in the upper one third of the forehead vertically above the lateral limbus of the cornea, 1.5 cm lateral to the medial injection site.  -Temporalis muscle injection, 4 sites, bilaterally. The first injection was 3 cm above the tragus of the ear, second injection site was 1.5 cm to 3 cm up from the first injection site in line with the tragus of the ear. The third injection site was 1.5-3 cm forward between the first 2 injection sites. The fourth injection site was 1.5 cm posterior to the second injection site.   -Occipitalis muscle injection, 3 sites, bilaterally. The first injection was done one half way between the occipital protuberance and the tip of the mastoid process behind the ear. The second injection site was done lateral and superior to the first, 1 fingerbreadth from the first injection. The third injection site was 1 fingerbreadth superiorly and medially from the first injection site.  -Cervical paraspinal muscle injection, 2 sites, bilateral knee first injection site was 1 cm from the midline of the cervical spine, 3 cm inferior to the lower border of the occipital protuberance. The second injection site was 1.5 cm superiorly and laterally to the first injection site.  -Trapezius muscle injection was performed at 3 sites, bilaterally. The first injection site was in the upper trapezius muscle halfway between the inflection point of  the neck, and the acromion. The second injection site was one half way between the acromion and the first injection site. The third injection was done between the first injection site and the inflection point of the neck.   Will return for repeat injection in 3 months.   155 units of Botox was used, 45U Botox not injected was wasted. The patient tolerated the procedure well, there were no complications of the above procedure.

## 2021-01-16 NOTE — Telephone Encounter (Signed)
I called [employer] to check the status of the appeal that we faxed to [employer] on 12/04/2020 with a receipt.  I was told they did not have the appeal.  I completed this over the phone for the confirmation #[id number].  I was told it would take 15 to 30 days to hear back.

## 2021-01-16 NOTE — Telephone Encounter (Signed)
Qulipta 60 mg Rx portion of [employer] assist application completed, signed, and faxed to [facility]. Received a receipt of confirmation. [phone].

## 2021-01-21 NOTE — Telephone Encounter (Signed)
Received a fax from [employer]. Emma Miller appeal has been denied due to patient not meeting requirements. Pt has to have tried Aimovig. I have faxed the denial letter over to [facility] assist to support pt's application for assistance. Received a receipt of confirmation.

## 2021-02-13 ENCOUNTER — Telehealth: Payer: Self-pay | Admitting: *Deleted

## 2021-02-13 NOTE — Telephone Encounter (Signed)
Pt called , she cannot get into mychart at this time.  She is asking to see if she can come in and get the rest of her BOTOX that she did not receive 45u (last botox 12-25-20).  She stated that Dr. Lucia Gaskins spoke of this when she was in the office.  She states the botox wears off about a month after she receives.  Has a migraine every day.  She asked about nerve blocks (I relayed backorder of medication used for this).  She asked about migraine surgery (decompression of nerve located where her migraine is located ?done by Plastic Surgery). These 2 later things maybe discuss when in in December (states pt).  She got approved for qulipta yesterday, she will be restarting this when she receives it in mail.  She was not sure it worked, but is willing to try.  Please advise on botox.

## 2021-02-15 NOTE — Telephone Encounter (Signed)
Pt called, would like a call from nurse to discuss seeing Dr. Lucia Gaskins early for Botox

## 2021-02-18 NOTE — Telephone Encounter (Signed)
I changed Botox visit to office visit- Dr. Lucia Gaskins will need to charge as office visit (ok to use sample, just can't bill for CPT [phone]) insurance only approves 1 visit every 12 weeks & last injection was 12/25/20. Just FYI

## 2021-02-18 NOTE — Telephone Encounter (Signed)
I called pt and relayed that Dr. Lucia Gaskins was able to see her tomorrow at 1330 for botox (using samples). 02-19-21. Pt verbalized that would work for her.  Placed appointment.

## 2021-02-18 NOTE — Telephone Encounter (Signed)
Called pt, could not LM as mailbox full. (I did send [employer]).

## 2021-02-19 ENCOUNTER — Ambulatory Visit (INDEPENDENT_AMBULATORY_CARE_PROVIDER_SITE_OTHER): Payer: 59 | Admitting: Neurology

## 2021-02-19 DIAGNOSIS — M5412 Radiculopathy, cervical region: Secondary | ICD-10-CM

## 2021-02-19 DIAGNOSIS — M4722 Other spondylosis with radiculopathy, cervical region: Secondary | ICD-10-CM | POA: Diagnosis not present

## 2021-02-19 DIAGNOSIS — R29898 Other symptoms and signs involving the musculoskeletal system: Secondary | ICD-10-CM

## 2021-02-19 DIAGNOSIS — R200 Anesthesia of skin: Secondary | ICD-10-CM | POA: Diagnosis not present

## 2021-02-19 NOTE — Progress Notes (Signed)
Botox- 100 units x 1 vials Lot: [id number] Expiration:  NDC: 9407-6808-81 SAMPLES

## 2021-02-19 NOTE — Patient Instructions (Addendum)
Occipital Neuralgia ?Occipital neuralgia is a type of headache that causes brief episodes of very bad pain in the back of the head. Pain from occipital neuralgia may spread (radiate) to other parts of the head. ?These headaches may be caused by irritation of the nerves that leave the spinal cord high up in the neck, just below the base of the skull (occipital nerves). The occipital nerves transmit sensations from the back of the head, the top of the head, and the areas behind the ears. ?What are the causes? ?This condition can occur without any known cause (primary headache syndrome). In other cases, this condition is caused by pressure on or irritation of one of the two occipital nerves. Pressure and irritation may be due to: ?Muscle spasm in the neck. ?Neck injury. ?Wear and tear of the vertebrae in the neck (osteoarthritis). ?Disease of the disks that separate the vertebrae. ?Swollen blood vessels that put pressure on the occipital nerves. ?Infections. ?Tumors. ?Diabetes. ?What are the signs or symptoms? ?This condition causes brief burning, stabbing, electric, shocking, or shooting pain in the back of the head that can radiate to the top of the head. It can happen on one side or both sides of the head. It can also cause: ?Pain behind the eye. ?Pain triggered by neck movement or hair brushing. ?Scalp tenderness. ?Aching in the back of the head between episodes of very bad pain. ?Pain that gets worse with exposure to bright lights. ?How is this diagnosed? ?Your health care provider may diagnose the condition based on a physical exam and your symptoms. Tests may be done, such as: ?Imaging studies of the brain and neck (cervical spine), such as an MRI or CT scan. These look for causes of pinched nerves. ?Applying pressure to the nerves in the neck to try to re-create the pain. ?Injection of numbing medicine into the occipital nerve areas to see if pain goes away (diagnostic nerve block). ?How is this  treated? ?Treatment for this condition may begin with simple measures, such as: ?Rest. ?Massage. ?Applying heat or cold to the area. ?Over-the-counter pain relievers. ?If these measures do not work, you may need other treatments, including: ?Medicines, such as: ?Prescription-strength anti-inflammatory medicines. ?Muscle relaxants. ?Anti-seizure medicines, which can relieve pain. ?Antidepressants, which can relieve pain. ?Injected medicines, such as medicines that numb the area (local anesthetic) and steroids. ?Pulsed radiofrequency ablation. This is when wires are implanted to deliver electrical impulses that block pain signals from the occipital nerve. ?Surgery to relieve nerve pressure. ?Physical therapy. ?Follow these instructions at home: ?Managing pain ?  ?Avoid any activities that cause pain. ?Rest when you have an attack of pain. ?Try gentle massage to relieve pain. ?Try a different pillow or sleeping position. ?If directed, apply heat to the affected area as often as told by your health care provider. Use the heat source that your health care provider recommends, such as a moist heat pack or a heating pad. ?Place a towel between your skin and the heat source. ?Leave the heat on for 20-30 minutes. ?Remove the heat if your skin turns bright red. This is especially important if you are unable to feel pain, heat, or cold. You have a greater risk of getting burned. ?If directed, put ice on the back of your head and neck area. To do this: ?Put ice in a plastic bag. ?Place a towel between your skin and the bag. ?Leave the ice on for 20 minutes, 2-3 times a day. ?Remove the ice if your skin   turns bright red. This is very important. If you cannot feel pain, heat, or cold, you have a greater risk of damage to the area. ?General instructions ?Take over-the-counter and prescription medicines only as told by your health care provider. ?Avoid things that make your symptoms worse, such as bright lights. ?Try to stay  active. Get regular exercise that does not cause pain. Ask your health care provider to suggest safe exercises for you. ?Work with a physical therapist to learn stretching exercises you can do at home. ?Practice good posture. ?Keep all follow-up visits. This is important. ?Contact a health care provider if: ?Your medicine is not working. ?You have new or worsening symptoms. ?Get help right away if: ?You have very bad head pain that does not go away. ?You have a sudden change in vision, balance, or speech. ?These symptoms may represent a serious problem that is an emergency. Do not wait to see if the symptoms will go away. Get medical help right away. Call your local emergency services (911 in the [place] Do not drive yourself to the hospital. ?Summary ?Occipital neuralgia is a type of headache that causes brief episodes of very bad pain in the back of the head. ?Pain from occipital neuralgia may spread (radiate) to other parts of the head. ?Treatment for this condition includes rest, massage, and medicines. ?This information is not intended to replace advice given to you by your health care provider. Make sure you discuss any questions you have with your health care provider. ?Document Revised: 02/12/2020 Document Reviewed: 02/12/2020 ?Elsevier Patient Education ? 2022 Elsevier Inc. ? ?

## 2021-02-19 NOTE — Progress Notes (Signed)
[facility]    Provider:  Dr Lucia Gaskins Requesting Provider: Farris Has, MD Primary Care Provider:  Farris Has, MD  02/19/21: Patient is back today after several botox appointments. Botox has been great and given her > 50% relief. But she just cannot get rid of the left temporal pain that radiates to the tmj and up to the temple. I put 15 U botox samples in the masseters and the rest aorund the tempoal areas to the ear. She also has chronic occipital neuralgia, chronic neck pain ongoing for > 6 months, she has radiation into the left shouder and arm, arm weakness on the left, worsening and progressive, she has been to massage, chiropractors regularly for > 8 weeks, she has had cupping and dry needling and been under the care of physicians for her left cervical radic and occipital pain for > 1 year and tried analgesics, pain meds, muscle relaxers that cause sedation. MRI cervical spine and will try nerve blocks next week.   CC:  migraines  HPI:  Emma Miller is a 40 y.o. female here as requested by Farris Has, MD for migraines. PMHx migraine headaches, B12 deficiency, ADD, insomnia, anxiety.  I reviewed Dr. Felicity Pellegrini notes: Patient appears to be in rizatriptan and Bernita Raisin which are medications that are used in migraine management, also daily B12 (states not taking currently).  She is currently a patient at the [facility] in [place] and transitioning to [facility].  Dr. Felicity Pellegrini examination which included head, eyes, neck, lungs, heart and, psych were all normal.  She has had them since HS, 20-25 days a month of migraines, she has 2 different kinds of migraines, she can get pain on the left side of the head in the temple area, and under her eye, she can wake up with it, waxes and wanes, worse aftre lunch and before bed, it is always throbbing and always there, she will only have a few days of no headaches a month, she has tried watching her  food and her environmental triggers. She also has other pains her neck her back and her hand on the left, they hurt. She just wants to put pressure on the spots her whole left arm and left side of her neck. Also light/sound sensitivity, pulsating/pounding/throbbing, nausea but no vomiting, a heating pad on the left side of her head and muscles help. Worse positionally and vision changes, can wake with headaches.  Lights and sirens always bother, she has motion sensitivity and doesn't ride. She even moved recently because of the traffic. She gets a 50% improvement in severity with her migraines but not frequency. No other focal neurologic deficits, associated symptoms, inciting events or modifiable factors.   Reviewed following report from prior notes, [facility]:  Current and past medications: ANALGESICS:aspirin, Excedrin, tylenol ANTI-MIGRAINE:imitrex (SE: malaise), Maxalt, zomig HEART/BP: metoprolol DECONGESTANT/ANTIHISTAMINE: allegra, benadryl, Claritin, Flonase, Nasonex, sudafed, zyrtec ANTI-NAUSEANT NSAIDS:ibuprofen, naproxen MUSCLE RELAXANTS: tizanidine ANTI-CONVULSANTS: topamax STEROIDS: SLEEPING PILLS/TRANQUILIZERS: Ambien, tylenol pm ANTI-DEPRESSANTS: Wellbutrin, Zoloft, amitriptyline, nortriptyline, paxil HERBAL: Magnesium FIBROMYALGIA:  HORMONAL: OTHER: Ajovy,  PROCEDURES FOR HEADACHES: Botox    Reviewed notes, labs and imaging from outside physicians, which showed: see above  Review of Systems: Patient complains of symptoms per HPI as well as the following symptoms: neck pain . Pertinent negatives and positives per HPI. All others negative .   Social History   Socioeconomic History   Marital status: Married    Spouse name: Not on file   Number  of children: 1   Years of education: 1 yr beauty school   Highest education level: Some college, no degree  Occupational History   Not on file  Tobacco Use   Smoking status: Never   Smokeless  tobacco: Never  Vaping Use   Vaping Use: Never used  Substance and Sexual Activity   Alcohol use: No    Alcohol/week: 0.0 standard drinks   Drug use: No   Sexual activity: Not on file  Other Topics Concern   Not on file  Social History Narrative   Lives at home with spouse and child   Right handed   Caffeine: 1 cup/day   Social Determinants of Health   Financial Resource Strain: Not on file  Food Insecurity: Not on file  Transportation Needs: Not on file  Physical Activity: Not on file  Stress: Not on file  Social Connections: Not on file  Intimate Partner Violence: Not on file    Family History  Problem Relation Age of Onset   Diabetes Father    Cervical cancer Mother    Migraines Mother        had TMJ surgery which resolved them   Migraines Daughter     Past Medical History:  Diagnosis Date   ADD (attention deficit disorder)    Anxiety    B12 deficiency    Insomnia    Migraine    Myofascial pain syndrome     Patient Active Problem List   Diagnosis Date Noted   Chronic migraine without aura, with intractable migraine, so stated, with status migrainosus 06/16/2020    Past Surgical History:  Procedure Laterality Date   APPENDECTOMY  2011   BARTHOLIN GLAND CYST     DILATION AND CURETTAGE OF UTERUS     WISDOM TOOTH EXTRACTION      Current Outpatient Medications  Medication Sig Dispense Refill   ADDERALL XR 30 MG 24 hr capsule Take 30 mg by mouth every morning.  0   Atogepant (QULIPTA) 60 MG TABS Take 60 mg by mouth daily. 30 tablet 0   Atogepant 60 MG TABS Take 60 mg by mouth daily. 30 tablet 6   baclofen (LIORESAL) 10 MG tablet Take 10 mg by mouth at bedtime as needed.     Cyanocobalamin (VITAMIN B-12 PO) Take by mouth.     fexofenadine (ALLEGRA) 180 MG tablet Take 180 mg by mouth daily.     fluticasone (FLONASE) 50 MCG/ACT nasal spray Place into the nose.     Lasmiditan Succinate (REYVOW) 100 MG TABS Take 100 mg by mouth once as needed for up to 1  dose. 1 tablet 0   norethindrone-ethinyl estradiol (JUNEL FE,GILDESS FE,LOESTRIN FE) 1-20 MG-MCG tablet Take 1 tablet by mouth.     OnabotulinumtoxinA (BOTOX IJ) Inject as directed every 3 (three) months.     rizatriptan (MAXALT) 10 MG tablet TAKE 1 TABLET(10 MG) BY MOUTH 1 TIME AS NEEDED FOR MIGRAINE. MAY REPEAT IN 2 HOURS AS NEEDED. MAX 3 TABLETS IN 24 HOURS 12 tablet 11   SUMAtriptan (IMITREX) 100 MG tablet TAKE 1 TABLET (100 MG TOTAL) BY MOUTH EVERY 2 (TWO) HOURS AS NEEDED FOR MIGRAINE.     SUMAtriptan Succinate (ZEMBRACE SYMTOUCH) 3 MG/0.5ML SOAJ Inject 3 mg into the skin once as needed for up to 1 dose. May repeat in 15 minutes. If symptoms persist, repeat in 2 hours. Max 4 injections daily. 0.5 mL 0   UBRELVY 100 MG TABS SMARTSIG:1 Tablet(s) By Mouth Every 2 Hours  PRN     Ubrogepant (UBRELVY) 100 MG TABS Take 100 mg by mouth every 2 (two) hours as needed. Maximum 200mg  a day. 16 tablet 11   zolpidem (AMBIEN) 5 MG tablet Take 5 mg by mouth at bedtime.     amphetamine-dextroamphetamine (ADDERALL) 20 MG tablet Take 20 mg by mouth. afternoon     SUMAtriptan (TOSYMRA) 10 MG/ACT SOLN Place 1 spray into the nose every 1 hour x 3 doses for 3 doses. 1 each 0   No current facility-administered medications for this visit.    Allergies as of 02/19/2021 - Review Complete 09/25/2020  Allergen Reaction Noted   Doxycycline Other (See Comments) 08/04/2014   Nsaids Swelling 06/14/2020    Vitals: There were no vitals taken for this visit. Last Weight:  Wt Readings from Last 1 Encounters:  06/14/20 117 lb (53.1 kg)   Last Height:   Ht Readings from Last 1 Encounters:  06/14/20 5\' 8"  (1.727 m)     Physical exam: Exam: Gen: NAD, conversant, well nourised, well groomed                     CV: RRR, no MRG. No Carotid Bruits. No peripheral edema, warm, nontender Eyes: Conjunctivae clear without exudates or hemorrhage  Neuro: Detailed Neurologic Exam  Speech:    Speech is normal; fluent and  spontaneous with normal comprehension.  Cognition:    The patient is oriented to person, place, and time;     recent and remote memory intact;     language fluent;     normal attention, concentration,     fund of knowledge Cranial Nerves:    The pupils are equal, round, and reactive to light. The fundi are normal and spontaneous venous pulsations are present. Visual fields are full to finger confrontation. Extraocular movements are intact. Trigeminal sensation is intact and the muscles of mastication are normal. The face is symmetric. The palate elevates in the midline. Hearing intact. Voice is normal. Shoulder shrug is normal. The tongue has normal motion without fasciculations.   Coordination:  no dysmetria or ataxia  Gait:    Normal native gait  Motor Observation:    No asymmetry, no atrophy, and no involuntary movements noted. Tone:    Normal muscle tone.    Posture:    Posture is normal. normal erect    Strength: Left arm prox weakness otherwise strength is V/V in the upper and lower limbs.      Sensation: intact to LT     Reflex Exam:  DTR's:    Deep tendon reflexes in the upper and lower extremities are normal bilaterally.   Toes:    The toes are downgoing bilaterally.   Clonus:    Clonus is absent.    Assessment/Plan:  Patient with chronic intractable headaches and migraines. She has failed multiple medications. Botox providing >50% relief in severity but still having daily and for the most part continuous headaches with concerning symptoms needs a thorough evaluation.  - Left temporal and TMJ pain. May be referred pain from jaw to the temple area. Placed extra botox there today (samples) from the temple to the ear and 15U in the left masseter.  - She also has chronic occipital neuralgia, chronic neck pain ongoing for > 6 months, she has radiation into the left shouder and arm, arm weakness on the left, worsening and progressive, she has been to massage, chiropractors  regularly for > 1 year, she has had cupping and dry  needling and been under the care of physicians for her left cervical radic and occipital pain for > 1 year and tried analgesics, pain meds, muscle relaxers that cause sedation. MRI cervical spine and will try nerve blocks next week. Placed some botox in the area to see if it helps - Start Qulipta preventative - Continue botox(she has been getting it from [facility], due now) - Not in the cervical paraspinals. Add more at left temple more where the migraines are the worst - Cephaly trial, did not work - [employer] - did not wotk - Continue Bernita Raisin - will send in script for 16 - MRI brain: unremarkable - Car glass tinting - recommended for light sensitivity.   Orders Placed This Encounter  Procedures   MR CERVICAL SPINE WO CONTRAST    No orders of the defined types were placed in this encounter.  Discussed: To prevent or relieve headaches, try the following: Cool Compress. Lie down and place a cool compress on your head.  Avoid headache triggers. If certain foods or odors seem to have triggered your migraines in the past, avoid them. A headache diary might help you identify triggers.  Include physical activity in your daily routine. Try a daily walk or other moderate aerobic exercise.  Manage stress. Find healthy ways to cope with the stressors, such as delegating tasks on your to-do list.  Practice relaxation techniques. Try deep breathing, yoga, massage and visualization.  Eat regularly. Eating regularly scheduled meals and maintaining a healthy diet might help prevent headaches. Also, drink plenty of fluids.  Follow a regular sleep schedule. Sleep deprivation might contribute to headaches Consider biofeedback. With this mind-body technique, you learn to control certain bodily functions -- such as muscle tension, heart rate and blood pressure -- to prevent headaches or reduce headache pain.    Proceed to emergency room if you experience new or  worsening symptoms or symptoms do not resolve, if you have new neurologic symptoms or if headache is severe, or for any concerning symptom.   Provided education and documentation from American headache Society toolbox including articles on: chronic migraine medication overuse headache, chronic migraines, prevention of migraines, behavioral and other nonpharmacologic treatments for headache.   Cc: Farris Has, MD,  Farris Has, MD  Naomie Dean, MD  [facility] [address] [place], [place] [address]  Phone [phone] Fax [phone]  I spent 30 minutes of face-to-face and non-face-to-face time with patient on the  1. Left arm weakness   2. Left arm numbness   3. Cervical radiculopathy at C5   4. Cervical spondylosis with radiculopathy    diagnosis.  This included previsit chart review, lab review, study review, order entry, electronic health record documentation, patient education on the different diagnostic and therapeutic options, counseling and coordination of care, risks and benefits of management, compliance, or risk factor reduction

## 2021-02-27 NOTE — Telephone Encounter (Signed)
Spoke with patient and scheduled her for a nerve block and to discuss her questions tomorrow afternoon at 3:30 PM.  Also answered her questions about the nerve block.  She verbalized appreciation for the call.

## 2021-02-28 ENCOUNTER — Other Ambulatory Visit: Payer: Self-pay

## 2021-02-28 ENCOUNTER — Ambulatory Visit (INDEPENDENT_AMBULATORY_CARE_PROVIDER_SITE_OTHER): Payer: 59 | Admitting: Neurology

## 2021-02-28 DIAGNOSIS — G43711 Chronic migraine without aura, intractable, with status migrainosus: Secondary | ICD-10-CM

## 2021-02-28 DIAGNOSIS — G8929 Other chronic pain: Secondary | ICD-10-CM

## 2021-02-28 DIAGNOSIS — M542 Cervicalgia: Secondary | ICD-10-CM | POA: Diagnosis not present

## 2021-02-28 DIAGNOSIS — R519 Headache, unspecified: Secondary | ICD-10-CM

## 2021-02-28 DIAGNOSIS — M5481 Occipital neuralgia: Secondary | ICD-10-CM | POA: Diagnosis not present

## 2021-02-28 NOTE — Progress Notes (Signed)
Last seen 02/19/2021: She also has chronic occipital neuralgia, chronic neck pain ongoing for > 6 months, she has radiation into the left shouder and arm, arm weakness on the left, worsening and progressive, she has been to massage, chiropractors regularly for > 8 weeks, she has had cupping and dry needling and been under the care of physicians for her left cervical radic and occipital pain for > 1 year and tried analgesics, pain meds, muscle relaxers that cause sedation. MRI cervical spine pending and will try nerve blocks today.   Performed by Dr. Lucia Gaskins M.D.  the 30-gauge needle was used. All procedures a documented blood were medically necessary, reasonable and appropriate based on the patient's history, medical diagnosis and physician opinion. Verbal informed consent was obtained from the patient, patient was informed of potential risk of procedure, including bruising, bleeding, hematoma formation, infection, muscle weakness, muscle pain, numbness, transient hypertension, transient hyperglycemia and transient insomnia among others. All areas injected were topically clean with isopropyl rubbing alcohol. Nonsterile nonlatex gloves were worn during the procedure.  1. Greater occipital nerve block [phone]). The greater occipital nerve site was identified at the nuchal line medial to the occipital artery. Medication was injected into the left occipital nerve areas and suboccipital areas. Patient's condition is associated with inflammation of the greater occipital nerve and associated multiple groups. Injection was deemed medically necessary, reasonable and appropriate. Injection represents a separate and unique surgical service.  2. Lesser occipital nerve block [phone]). The lesser occipital nerve site was identified approximately 2 cm lateral to the greater occipital nerve. Occasion was injected into the left occipital nerve areas. Patient's condition is associated with inflammation of the lesser occipital nerve  and associated muscle groups. Injection was deemed medically necessary, reasonable and appropriate. Injection represents a separate and unique surgical service.   3. Auriculotemporal nerve block ([id number]): The Auriculotemporal nerve site was identified along the posterior margin of the sternocleidomastoid muscle toward the base of the ear. Medication was injected into the left radicular temporal nerve areas. Patient's condition is associated with inflammation of the Auriculotemporal Nerve and associated muscle groups. Injection was deemed medically necessary, reasonable and appropriate. Injection represents a separate and unique surgical service.  I spent 30 minutes of face-to-face and non-face-to-face time with patient on the  1. Chronic migraine without aura, with intractable migraine, so stated, with status migrainosus   2. Chronic intractable headache, unspecified headache type   3. Cervicalgia   4. Occipital neuralgia of left side    diagnosis.  This included previsit chart review, lab review, study review, order entry, electronic health record documentation, patient education on the different diagnostic and therapeutic options, counseling and coordination of care, risks and benefits of management, compliance, or risk factor reduction

## 2021-02-28 NOTE — Progress Notes (Signed)
Nerve block  (w/o) steroid: Pt signed consent yes   0.5% Bupivocaine 12 mL LOT: [id number] EXP: 08/27/2022 NDC: 5681-2751-70

## 2021-03-07 ENCOUNTER — Ambulatory Visit
Admission: RE | Admit: 2021-03-07 | Discharge: 2021-03-07 | Disposition: A | Discharge status: Home / Self Care | Payer: 59 | Source: Ambulatory Visit | Attending: Neurology | Admitting: Neurology

## 2021-03-07 ENCOUNTER — Other Ambulatory Visit: Payer: Self-pay

## 2021-03-07 DIAGNOSIS — M5412 Radiculopathy, cervical region: Secondary | ICD-10-CM

## 2021-03-07 DIAGNOSIS — R200 Anesthesia of skin: Secondary | ICD-10-CM

## 2021-03-07 DIAGNOSIS — M4722 Other spondylosis with radiculopathy, cervical region: Secondary | ICD-10-CM

## 2021-03-07 DIAGNOSIS — R29898 Other symptoms and signs involving the musculoskeletal system: Secondary | ICD-10-CM | POA: Diagnosis not present

## 2021-03-11 DIAGNOSIS — M542 Cervicalgia: Secondary | ICD-10-CM

## 2021-03-11 DIAGNOSIS — M47812 Spondylosis without myelopathy or radiculopathy, cervical region: Secondary | ICD-10-CM

## 2021-03-11 NOTE — Telephone Encounter (Signed)
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 Luckily the spinal cord looks normal and there are no pinched nerves. But the artrhitis may be contributing to neck pain radiating up the back of the head we can send you to Dr. Jordan Likes for injections called medial branch blocks which may help, will ask my nurse to call you and see ifyou are interested thanks.  Written by Anson Fret, MD on 03/11/2021  8:30 AM EST Seen by patient Emma Miller on 03/11/2021 11:34 AM   Referral placed per Dr Lucia Gaskins

## 2021-03-14 NOTE — Telephone Encounter (Signed)
Referral has been sent to [facility]/Dr. Jordan Likes. Phone: [phone].

## 2021-03-20 ENCOUNTER — Telehealth: Payer: Self-pay | Admitting: Neurology

## 2021-03-20 NOTE — Telephone Encounter (Signed)
Patient has a Botox appointment 12/6. Botox arrived from [facility] 11/22. PA for CPT [phone] w/ [facility] expired 11/21. I filled out new PA request and faxed to plan with notes.   PA for CPT [phone] w/ [employer] does not expire until 07/02/21. PA #[id number]. [employer]: [phone].

## 2021-03-25 NOTE — Telephone Encounter (Signed)
Received approval from [facility]. PA #[id number] (03/20/21- 04/27/21).

## 2021-04-02 ENCOUNTER — Other Ambulatory Visit: Payer: Self-pay

## 2021-04-02 ENCOUNTER — Ambulatory Visit (INDEPENDENT_AMBULATORY_CARE_PROVIDER_SITE_OTHER): Payer: 59 | Admitting: Neurology

## 2021-04-02 DIAGNOSIS — G43711 Chronic migraine without aura, intractable, with status migrainosus: Secondary | ICD-10-CM

## 2021-04-02 NOTE — Progress Notes (Signed)
Consent Form Botulism Toxin Injection For Chronic Migraine 04/02/2021: Stable, > 60% improvement frequency and severity. 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 No traps or cervical muscles. See pics 12/25/2020: Stable doing great. Third botox. 09/25/2020: second botox.  Look at the pictures from this date.  No traps or cervical muscles.  She is allergic to nsaids cannot try cambia. Try to get eletriptan approved.  Reviewed orally with patient, additionally signature is on file:  Botulism toxin has been approved by the Federal drug administration for treatment of chronic migraine. Botulism toxin does not cure chronic migraine and it may not be effective in some patients.  The administration of botulism toxin is accomplished by injecting a small amount of toxin into the muscles of the neck and head. Dosage must be titrated for each individual. Any benefits resulting from botulism toxin tend to wear off after 3 months with a repeat injection required if benefit is to be maintained. Injections are usually done every 3-4 months with maximum effect peak achieved by about 2 or 3 weeks. Botulism toxin is expensive and you should be sure of what costs you will incur resulting from the injection.  The side effects of botulism toxin use for chronic migraine may include:   -Transient, and usually mild, facial weakness with facial injections  -Transient, and usually mild, head or neck weakness with head/neck injections  -Reduction or loss of forehead facial animation due to forehead muscle weakness  -Eyelid drooping  -Dry eye  -Pain at the site of injection or bruising at the site of injection  -Double vision  -Potential unknown long term risks  Contraindications: You should not have Botox if you are pregnant, nursing, allergic to albumin, have an infection, skin condition, or muscle weakness at the site of the injection, or have myasthenia gravis, Lambert-Eaton syndrome, or ALS.  It is also possible that as with any  injection, there may be an allergic reaction or no effect from the medication. Reduced effectiveness after repeated injections is sometimes seen and rarely infection at the injection site may occur. All care will be taken to prevent these side effects. If therapy is given over a long time, atrophy and wasting in the muscle injected may occur. Occasionally the patient's become refractory to treatment because they develop antibodies to the toxin. In this event, therapy needs to be modified.  I have read the above information and consent to the administration of botulism toxin.    BOTOX PROCEDURE NOTE FOR MIGRAINE HEADACHE    Contraindications and precautions discussed with patient(above). Aseptic procedure was observed and patient tolerated procedure. Procedure performed by Dr. Artemio Aly  The condition has existed for more than 6 months, and pt does not have a diagnosis of ALS, Myasthenia Gravis or Lambert-Eaton Syndrome.  Risks and benefits of injections discussed and pt agrees to proceed with the procedure.  Written consent obtained  These injections are medically necessary. Pt  receives good benefits from these injections. These injections do not cause sedations or hallucinations which the oral therapies may cause.  Description of procedure:  The patient was placed in a sitting position. The standard protocol was used for Botox as follows, with 5 units of Botox injected at each site:   -Procerus muscle, midline injection  -Corrugator muscle, bilateral injection  -Frontalis muscle, bilateral injection, with 2 sites each side, medial injection was performed in the upper one third of the frontalis muscle, in the region vertical from the medial inferior edge of the superior orbital rim. The  lateral injection was again in the upper one third of the forehead vertically above the lateral limbus of the cornea, 1.5 cm lateral to the medial injection site.  -Temporalis muscle injection, 4 sites,  bilaterally. The first injection was 3 cm above the tragus of the ear, second injection site was 1.5 cm to 3 cm up from the first injection site in line with the tragus of the ear. The third injection site was 1.5-3 cm forward between the first 2 injection sites. The fourth injection site was 1.5 cm posterior to the second injection site.   -Occipitalis muscle injection, 3 sites, bilaterally. The first injection was done one half way between the occipital protuberance and the tip of the mastoid process behind the ear. The second injection site was done lateral and superior to the first, 1 fingerbreadth from the first injection. The third injection site was 1 fingerbreadth superiorly and medially from the first injection site.  -Cervical paraspinal muscle injection, 2 sites, bilateral knee first injection site was 1 cm from the midline of the cervical spine, 3 cm inferior to the lower border of the occipital protuberance. The second injection site was 1.5 cm superiorly and laterally to the first injection site.  -Trapezius muscle injection was performed at 3 sites, bilaterally. The first injection site was in the upper trapezius muscle halfway between the inflection point of the neck, and the acromion. The second injection site was one half way between the acromion and the first injection site. The third injection was done between the first injection site and the inflection point of the neck.   Will return for repeat injection in 3 months.   155 units of Botox was used, 45U Botox not injected was wasted. The patient tolerated the procedure well, there were no complications of the above procedure.

## 2021-04-02 NOTE — Progress Notes (Signed)
Botox consent signed  Botox- 200 units x 1 vial Lot: [id number] Expiration: 06/2023 NDC: 6629-4765-46  Dx: T03.546 S/P

## 2021-05-11 ENCOUNTER — Encounter (HOSPITAL_BASED_OUTPATIENT_CLINIC_OR_DEPARTMENT_OTHER): Payer: Self-pay | Admitting: Emergency Medicine

## 2021-05-11 ENCOUNTER — Emergency Department (HOSPITAL_BASED_OUTPATIENT_CLINIC_OR_DEPARTMENT_OTHER)
Admission: EM | Admit: 2021-05-11 | Discharge: 2021-05-11 | Disposition: A | Discharge status: Home / Self Care | Payer: 59 | Attending: Emergency Medicine | Admitting: Emergency Medicine

## 2021-05-11 ENCOUNTER — Other Ambulatory Visit: Payer: Self-pay

## 2021-05-11 DIAGNOSIS — J111 Influenza due to unidentified influenza virus with other respiratory manifestations: Secondary | ICD-10-CM

## 2021-05-11 DIAGNOSIS — Z20822 Contact with and (suspected) exposure to covid-19: Secondary | ICD-10-CM | POA: Diagnosis not present

## 2021-05-11 DIAGNOSIS — N644 Mastodynia: Secondary | ICD-10-CM | POA: Insufficient documentation

## 2021-05-11 DIAGNOSIS — J101 Influenza due to other identified influenza virus with other respiratory manifestations: Secondary | ICD-10-CM | POA: Insufficient documentation

## 2021-05-11 DIAGNOSIS — R059 Cough, unspecified: Secondary | ICD-10-CM | POA: Diagnosis present

## 2021-05-11 LAB — RESP PANEL BY RT-PCR (FLU A&B, COVID) ARPGX2
Influenza A by PCR: POSITIVE — AB
Influenza B by PCR: NEGATIVE
SARS Coronavirus 2 by RT PCR: NEGATIVE

## 2021-05-11 LAB — PREGNANCY, URINE: Preg Test, Ur: NEGATIVE

## 2021-05-11 MED ORDER — BENZONATATE 100 MG PO CAPS: 100.0000 mg | ORAL_CAPSULE | Freq: Three times a day (TID) | ORAL | 0 refills | Status: DC

## 2021-05-11 MED ORDER — BENZONATATE 100 MG PO CAPS: 100.0000 mg | ORAL_CAPSULE | Freq: Three times a day (TID) | ORAL | 0 refills | Status: AC

## 2021-05-11 NOTE — ED Triage Notes (Signed)
Pts family (husband and daughter recovering from covid). Pt has congested cough , and general fatigue. Pt reports she also forgot to take her birth control pills for 2 weeks and her breasts/nipples have been hurting. She has restarted hormones.

## 2021-05-11 NOTE — Discharge Instructions (Addendum)
Your influenza test was positive.  Your COVID test is negative.  Your pregnancy test was negative.  You will need to follow-up with your OB/GYN about your breast pain.  It is unclear etiology at this time but I have low suspicion for an emergent cause of your symptoms that would require you to have further work-up or admission at this time.  Please call your OB/GYN next week to schedule an appointment for follow-up.  Additionally I gave you some cough medicine to take for your influenza.  Make sure to treat your fevers with Tylenol, Motrin and stay well-hydrated.  Follow-up with your regular doctor in about a week and return to the emergency department for new or worsening symptoms

## 2021-05-11 NOTE — ED Provider Notes (Signed)
[facility]   CSN: [id number] Arrival date & time: 05/11/21  1808     History  Chief Complaint  Patient presents with   Cough    Emma AlbinoLinda M Miller is a 41 y.o. female.  HPI  41 year old female with a history of ADD, anxiety, B12 deficiency, insomnia, migraines, myofascial pain syndrome, who presents to the emergency department today for evaluation of URI symptoms and nipple pain.  Patient states that her daughter has been diagnosed with the flu and her husband has diagnosed with COVID. She reports a cough that started yesterday. She further reports body aches, chills, subjective fevers. She had a negative covid test at home.   Additionally reports that for the last few days she has been having bilateral nipple pain.  She notes that she stopped taking her birth control about 3 weeks ago and just recently noticed the pain.  She is not had any nipple discharge or any discoloration to the breast.  She has not had any fevers.  She denies any exacerbating or alleviating factors.  Home Medications Prior to Admission medications   Medication Sig Start Date End Date Taking? Authorizing Provider  benzonatate (TESSALON) 100 MG capsule Take 1 capsule (100 mg total) by mouth every 8 (eight) hours for 5 days. 05/11/21 05/16/21 Yes Kristian Mogg S, PA-C  ADDERALL XR 30 MG 24 hr capsule Take 30 mg by mouth every morning. 07/18/14   [provider]  amphetamine-dextroamphetamine (ADDERALL) 20 MG tablet Take 20 mg by mouth. afternoon 11/28/13 06/14/20  [provider]  Atogepant (QULIPTA) 60 MG TABS Take 60 mg by mouth daily. 09/25/20   Anson FretAhern, Antonia B, MD  Atogepant 60 MG TABS Take 60 mg by mouth daily. 06/14/20   Anson FretAhern, Antonia B, MD  baclofen (LIORESAL) 10 MG tablet Take 10 mg by mouth at bedtime as needed. 06/01/20   [provider]  Cyanocobalamin (VITAMIN B-12 PO) Take by mouth.    [provider]  fexofenadine (ALLEGRA) 180 MG  tablet Take 180 mg by mouth daily.    [provider]  fluticasone (FLONASE) 50 MCG/ACT nasal spray Place into the nose. 01/09/17   [provider]  Lasmiditan Succinate (REYVOW) 100 MG TABS Take 100 mg by mouth once as needed for up to 1 dose. 09/25/20   Anson FretAhern, Antonia B, MD  norethindrone-ethinyl estradiol (JUNEL FE,GILDESS FE,LOESTRIN FE) 1-20 MG-MCG tablet Take 1 tablet by mouth. 07/25/13   [provider]  OnabotulinumtoxinA (BOTOX IJ) Inject as directed every 3 (three) months.    [provider]  rizatriptan (MAXALT) 10 MG tablet TAKE 1 TABLET(10 MG) BY MOUTH 1 TIME AS NEEDED FOR MIGRAINE. MAY REPEAT IN 2 HOURS AS NEEDED. MAX 3 TABLETS IN 24 HOURS 08/14/20   Anson FretAhern, Antonia B, MD  SUMAtriptan (IMITREX) 100 MG tablet TAKE 1 TABLET (100 MG TOTAL) BY MOUTH EVERY 2 (TWO) HOURS AS NEEDED FOR MIGRAINE. 10/20/13   [provider]  SUMAtriptan (TOSYMRA) 10 MG/ACT SOLN Place 1 spray into the nose every 1 hour x 3 doses for 3 doses. 09/25/20 09/26/20  Anson FretAhern, Antonia B, MD  SUMAtriptan Succinate (ZEMBRACE SYMTOUCH) 3 MG/0.5ML SOAJ Inject 3 mg into the skin once as needed for up to 1 dose. May repeat in 15 minutes. If symptoms persist, repeat in 2 hours. Max 4 injections daily. 09/25/20   Anson FretAhern, Antonia B, MD  UBRELVY 100 MG TABS SMARTSIG:1 Tablet(s) By Mouth Every 2 Hours PRN 06/01/20   [provider]  Ubrogepant (UBRELVY) 100 MG TABS Take 100 mg by mouth every 2 (two) hours as needed. Maximum 200mg  a day. 06/16/20   06/18/20, MD  zolpidem (AMBIEN) 5 MG tablet Take 5 mg by mouth at bedtime.    [provider]      Allergies    Doxycycline and Nsaids    Review of Systems   Review of Systems  Constitutional:  Negative for fever.  Respiratory:  Positive for cough.   Cardiovascular:        Nipple pain   Physical Exam Updated Vital Signs BP 128/84    Pulse 88    Temp 98.4 F (36.9 C) (Oral)    Resp 20    SpO2 100%  Physical Exam Vitals and  nursing note reviewed.  Constitutional:      General: She is not in acute distress.    Appearance: She is well-developed.  HENT:     Head: Normocephalic and atraumatic.  Eyes:     Conjunctiva/sclera: Conjunctivae normal.  Cardiovascular:     Rate and Rhythm: Normal rate.  Pulmonary:     Effort: Pulmonary effort is normal.  Chest:     Comments: No focal tenderness to the bilat breasts. No masses, skin changes, induration, warmth or fluctuance. No nipple discharge. No lymphadenopathy to the bilat axilla Musculoskeletal:        General: Normal range of motion.     Cervical back: Neck supple.  Skin:    General: Skin is warm and dry.  Neurological:     Mental Status: She is alert.    ED Results / Procedures / Treatments   Labs (all labs ordered are listed, but only abnormal results are displayed) Labs Reviewed  RESP PANEL BY RT-PCR (FLU A&B, COVID) ARPGX2 - Abnormal; Notable for the following components:      Result Value   Influenza A by PCR POSITIVE (*)    All other components within normal limits  PREGNANCY, URINE    EKG None  Radiology No results found.  Procedures Procedures    Medications Ordered in ED Medications - No data to display  ED Course/ Medical Decision Making/ A&P                           Medical Decision Making  41 year old female presents emergency department today for evaluation of multiple complaints.  She is been having URI symptoms and has been exposed to COVID and the flu at home.  She is not interested in taking Tamiflu.  Additionally she is concerned as she is having bilateral nipple pain.  She does not have any discharge.  She has no signs of skin infection on exam.  She has had a negative mammogram within the last year or so.  She has an OB/GYN.  Her pregnancy test is negative.  At this time there is unclear etiology of her nipple pain however this may be related to her menstrual cycle and change in hormones after cessation of birth control.   I recommend that she follow-up with her OB/GYN about this.  Her COVID test is negative here but her flu test is positive.  I will give her a prescription for Tessalon for her cough.  Again she is not interested in Tamiflu.  Advised on follow-up and return precautions.  She voiced understanding the plan and reasons to return.  All questions answered.  Patient stable for discharge   Final Clinical Impression(s) / ED  Diagnoses Final diagnoses:  Influenza  Nipple pain    Rx / DC Orders ED Discharge Orders          Ordered    benzonatate (TESSALON) 100 MG capsule  Every 8 hours        05/11/21 2024              Karrie Meres, PA-C 05/11/21 2024    Terald Sleeper, MD 05/11/21 [phone]

## 2021-05-14 ENCOUNTER — Telehealth: Payer: Self-pay | Admitting: Neurology

## 2021-05-14 NOTE — Telephone Encounter (Signed)
Patient has a Botox appointment 3/7. Her insurance [facility]) is no longer active. Will reach out to patient to get her new information.

## 2021-06-06 NOTE — Telephone Encounter (Signed)
Received approval from [employer]. Reference [id number] (06/06/2021-09/03/2021.

## 2021-06-06 NOTE — Telephone Encounter (Signed)
I called patient to get updated insurance info. Patient now has [employer]. Faxed PA form with OV notes to [employer].

## 2021-06-24 ENCOUNTER — Other Ambulatory Visit: Payer: Self-pay | Admitting: Neurology

## 2021-06-24 DIAGNOSIS — G43711 Chronic migraine without aura, intractable, with status migrainosus: Secondary | ICD-10-CM

## 2021-07-02 ENCOUNTER — Ambulatory Visit: Payer: 59 | Admitting: Neurology

## 2021-07-02 DIAGNOSIS — G43711 Chronic migraine without aura, intractable, with status migrainosus: Secondary | ICD-10-CM | POA: Diagnosis not present

## 2021-07-02 MED ORDER — EMGALITY 120 MG/ML ~~LOC~~ SOAJ: 120.0000 mg | SUBCUTANEOUS | 11 refills | Status: AC

## 2021-07-02 NOTE — Progress Notes (Signed)
Botox- 200 units x 1 vial ?Lot: [id number] ?Expiration: 12/2023 ?NDC: 0023-3921-02 ? ?Bacteriostatic 0.9% Sodium Chloride- 4mL total ?Lot: [id number] ?Expiration: 11/27/2022 ?NDC: 0409-1966-02 ? ?Dx: G43.711 ?B/B ? ?

## 2021-07-02 NOTE — Progress Notes (Signed)
Consent Form ?Botulism Toxin Injection For Chronic Migraine ?07/02/2021: Stable ?04/02/2021: Stable, > 60% improvement severity but not frequency try emgality too. 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 No traps or cervical muscles. See pics ?12/25/2020: Stable doing great. Third botox. ?09/25/2020: second botox.  Look at the pictures from this date.  No traps or cervical muscles.  She is allergic to nsaids cannot try cambia. Try to get eletriptan approved. ? ?Reviewed orally with patient, additionally signature is on file: ? ?Botulism toxin has been approved by the Federal drug administration for treatment of chronic migraine. Botulism toxin does not cure chronic migraine and it may not be effective in some patients. ? ?The administration of botulism toxin is accomplished by injecting a small amount of toxin into the muscles of the neck and head. Dosage must be titrated for each individual. Any benefits resulting from botulism toxin tend to wear off after 3 months with a repeat injection required if benefit is to be maintained. Injections are usually done every 3-4 months with maximum effect peak achieved by about 2 or 3 weeks. Botulism toxin is expensive and you should be sure of what costs you will incur resulting from the injection. ? ?The side effects of botulism toxin use for chronic migraine may include: ? ? -Transient, and usually mild, facial weakness with facial injections ? -Transient, and usually mild, head or neck weakness with head/neck injections ? -Reduction or loss of forehead facial animation due to forehead muscle weakness ? -Eyelid drooping ? -Dry eye ? -Pain at the site of injection or bruising at the site of injection ? -Double vision ? -Potential unknown long term risks ? ?Contraindications: You should not have Botox if you are pregnant, nursing, allergic to albumin, have an infection, skin condition, or muscle weakness at the site of the injection, or have myasthenia gravis, Lambert-Eaton syndrome, or ALS. ? ?It  is also possible that as with any injection, there may be an allergic reaction or no effect from the medication. Reduced effectiveness after repeated injections is sometimes seen and rarely infection at the injection site may occur. All care will be taken to prevent these side effects. If therapy is given over a long time, atrophy and wasting in the muscle injected may occur. Occasionally the patient's become refractory to treatment because they develop antibodies to the toxin. In this event, therapy needs to be modified. ? ?I have read the above information and consent to the administration of botulism toxin. ? ? ? ?BOTOX PROCEDURE NOTE FOR MIGRAINE HEADACHE ? ? ? ?Contraindications and precautions discussed with patient(above). Aseptic procedure was observed and patient tolerated procedure. Procedure performed by Dr. Artemio Aly ? ?The condition has existed for more than 6 months, and pt does not have a diagnosis of ALS, Myasthenia Gravis or Lambert-Eaton Syndrome.  Risks and benefits of injections discussed and pt agrees to proceed with the procedure.  Written consent obtained ? ?These injections are medically necessary. Pt  receives good benefits from these injections. These injections do not cause sedations or hallucinations which the oral therapies may cause. ? ?Description of procedure: ? ?The patient was placed in a sitting position. The standard protocol was used for Botox as follows, with 5 units of Botox injected at each site: ? ? ?-Procerus muscle, midline injection ? ?-Corrugator muscle, bilateral injection ? ?-Frontalis muscle, bilateral injection, with 2 sites each side, medial injection was performed in the upper one third of the frontalis muscle, in the region vertical from the medial inferior edge  of the superior orbital rim. The lateral injection was again in the upper one third of the forehead vertically above the lateral limbus of the cornea, 1.5 cm lateral to the medial injection  site. ? ?-Temporalis muscle injection, 4 sites, bilaterally. The first injection was 3 cm above the tragus of the ear, second injection site was 1.5 cm to 3 cm up from the first injection site in line with the tragus of the ear. The third injection site was 1.5-3 cm forward between the first 2 injection sites. The fourth injection site was 1.5 cm posterior to the second injection site.  ? ?-Occipitalis muscle injection, 3 sites, bilaterally. The first injection was done one half way between the occipital protuberance and the tip of the mastoid process behind the ear. The second injection site was done lateral and superior to the first, 1 fingerbreadth from the first injection. The third injection site was 1 fingerbreadth superiorly and medially from the first injection site. ? ?-Cervical paraspinal muscle injection, 2 sites, bilateral knee first injection site was 1 cm from the midline of the cervical spine, 3 cm inferior to the lower border of the occipital protuberance. The second injection site was 1.5 cm superiorly and laterally to the first injection site. ? ?-Trapezius muscle injection was performed at 3 sites, bilaterally. The first injection site was in the upper trapezius muscle halfway between the inflection point of the neck, and the acromion. The second injection site was one half way between the acromion and the first injection site. The third injection was done between the first injection site and the inflection point of the neck. ? ? ?Will return for repeat injection in 3 months. ? ? ?155 units of Botox was used, 45U Botox not injected was wasted. The patient tolerated the procedure well, there were no complications of the above procedure. ? ? ? ?

## 2021-07-04 ENCOUNTER — Telehealth: Payer: Self-pay | Admitting: *Deleted

## 2021-07-04 NOTE — Telephone Encounter (Signed)
Alan Muhlbauer (Key: [id number]) ?Rx #: [id number] ?Emgality 120MG /ML auto-injectors (migraine) ? ?Waiting on Approval  ?

## 2021-07-04 NOTE — Telephone Encounter (Signed)
Emgality has been approved  ?Approval Dates 07/04/2021-01/04/2022  ? ?Will contact patient through mychart to make her aware of approval  ?

## 2021-07-09 ENCOUNTER — Telehealth: Payer: Self-pay | Admitting: *Deleted

## 2021-07-09 NOTE — Telephone Encounter (Signed)
Completed Bernita Raisin PA on Cover My Meds. Key: [id number]. Awaiting determination from [employer] [facility].  ?

## 2021-07-10 NOTE — Telephone Encounter (Signed)
Emma Miller approved by [employer] [facility] from 07/09/21-07/10/22. Approval faxed to pharmacy.   ?

## 2021-08-22 NOTE — Telephone Encounter (Signed)
Completed Botox PA form, placed in Nurse pod for MD signature. ?

## 2021-08-26 NOTE — Telephone Encounter (Signed)
Faxed PA form with OV notes to [employer]. ?

## 2021-08-27 ENCOUNTER — Other Ambulatory Visit: Payer: Self-pay | Admitting: Neurology

## 2021-08-27 ENCOUNTER — Telehealth: Payer: Self-pay | Admitting: Neurology

## 2021-08-27 MED ORDER — SUMATRIPTAN SUCCINATE 100 MG PO TABS: 100.0000 mg | ORAL_TABLET | ORAL | 11 refills | Status: AC | PRN

## 2021-08-27 NOTE — Telephone Encounter (Signed)
Pt requesting refill for SUMAtriptan (IMITREX) 100 MG tablet at [facility] [id number] ?

## 2021-09-11 ENCOUNTER — Encounter: Payer: Self-pay | Admitting: *Deleted

## 2021-09-24 ENCOUNTER — Ambulatory Visit: Payer: Self-pay | Admitting: Neurology

## 2021-09-24 ENCOUNTER — Telehealth: Payer: Self-pay | Admitting: Neurology

## 2021-09-24 ENCOUNTER — Ambulatory Visit (INDEPENDENT_AMBULATORY_CARE_PROVIDER_SITE_OTHER): Payer: 59 | Admitting: Neurology

## 2021-09-24 DIAGNOSIS — G43711 Chronic migraine without aura, intractable, with status migrainosus: Secondary | ICD-10-CM

## 2021-09-24 NOTE — Telephone Encounter (Signed)
Pt is requesting a call back from billing to discuss her balance. She was given a copy of her balance summary but would like for someone to go over it with her.

## 2021-09-24 NOTE — Progress Notes (Signed)
Consent Form Botulism Toxin Injection For Chronic Migraine 09/24/2021: > 60% improvement in severity and frequency 07/02/2021: Stable 04/02/2021: Stable, > 60% improvement severity but not frequency try emgality too. 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 No traps or cervical muscles. See pics 12/25/2020: Stable doing great. Third botox. 09/25/2020: second botox.  Look at the pictures from this date.  No traps or cervical muscles.  She is allergic to nsaids cannot try cambia. Try to get eletriptan approved.  Reviewed orally with patient, additionally signature is on file:  Botulism toxin has been approved by the Federal drug administration for treatment of chronic migraine. Botulism toxin does not cure chronic migraine and it may not be effective in some patients.  The administration of botulism toxin is accomplished by injecting a small amount of toxin into the muscles of the neck and head. Dosage must be titrated for each individual. Any benefits resulting from botulism toxin tend to wear off after 3 months with a repeat injection required if benefit is to be maintained. Injections are usually done every 3-4 months with maximum effect peak achieved by about 2 or 3 weeks. Botulism toxin is expensive and you should be sure of what costs you will incur resulting from the injection.  The side effects of botulism toxin use for chronic migraine may include:   -Transient, and usually mild, facial weakness with facial injections  -Transient, and usually mild, head or neck weakness with head/neck injections  -Reduction or loss of forehead facial animation due to forehead muscle weakness  -Eyelid drooping  -Dry eye  -Pain at the site of injection or bruising at the site of injection  -Double vision  -Potential unknown long term risks  Contraindications: You should not have Botox if you are pregnant, nursing, allergic to albumin, have an infection, skin condition, or muscle weakness at the site of the injection, or have  myasthenia gravis, Lambert-Eaton syndrome, or ALS.  It is also possible that as with any injection, there may be an allergic reaction or no effect from the medication. Reduced effectiveness after repeated injections is sometimes seen and rarely infection at the injection site may occur. All care will be taken to prevent these side effects. If therapy is given over a long time, atrophy and wasting in the muscle injected may occur. Occasionally the patient's become refractory to treatment because they develop antibodies to the toxin. In this event, therapy needs to be modified.  I have read the above information and consent to the administration of botulism toxin.    BOTOX PROCEDURE NOTE FOR MIGRAINE HEADACHE    Contraindications and precautions discussed with patient(above). Aseptic procedure was observed and patient tolerated procedure. Procedure performed by Dr. Artemio Aly  The condition has existed for more than 6 months, and pt does not have a diagnosis of ALS, Myasthenia Gravis or Lambert-Eaton Syndrome.  Risks and benefits of injections discussed and pt agrees to proceed with the procedure.  Written consent obtained  These injections are medically necessary. Pt  receives good benefits from these injections. These injections do not cause sedations or hallucinations which the oral therapies may cause.  Description of procedure:  The patient was placed in a sitting position. The standard protocol was used for Botox as follows, with 5 units of Botox injected at each site:   -Procerus muscle, midline injection  -Corrugator muscle, bilateral injection  -Frontalis muscle, bilateral injection, with 2 sites each side, medial injection was performed in the upper one third of the frontalis muscle, in  the region vertical from the medial inferior edge of the superior orbital rim. The lateral injection was again in the upper one third of the forehead vertically above the lateral limbus of the  cornea, 1.5 cm lateral to the medial injection site.  -Temporalis muscle injection, 4 sites, bilaterally. The first injection was 3 cm above the tragus of the ear, second injection site was 1.5 cm to 3 cm up from the first injection site in line with the tragus of the ear. The third injection site was 1.5-3 cm forward between the first 2 injection sites. The fourth injection site was 1.5 cm posterior to the second injection site.   -Occipitalis muscle injection, 3 sites, bilaterally. The first injection was done one half way between the occipital protuberance and the tip of the mastoid process behind the ear. The second injection site was done lateral and superior to the first, 1 fingerbreadth from the first injection. The third injection site was 1 fingerbreadth superiorly and medially from the first injection site.  -Cervical paraspinal muscle injection, 2 sites, bilateral knee first injection site was 1 cm from the midline of the cervical spine, 3 cm inferior to the lower border of the occipital protuberance. The second injection site was 1.5 cm superiorly and laterally to the first injection site.  -Trapezius muscle injection was performed at 3 sites, bilaterally. The first injection site was in the upper trapezius muscle halfway between the inflection point of the neck, and the acromion. The second injection site was one half way between the acromion and the first injection site. The third injection was done between the first injection site and the inflection point of the neck.   Will return for repeat injection in 3 months.   155 units of Botox was used, 45U Botox not injected was wasted. The patient tolerated the procedure well, there were no complications of the above procedure.

## 2021-09-24 NOTE — Progress Notes (Signed)
Botox- 200 units x 1 vial Lot :[id number] Expiration: 07/2023 NDC: 0023-3921-02  Bacteriostatic 0.9% Sodium Chloride- 4mL total Lot: [id number] Expiration: 11/27/2022 NDC: 0409-1966-02  Dx: G43.711 B/B 

## 2021-12-03 ENCOUNTER — Encounter (HOSPITAL_BASED_OUTPATIENT_CLINIC_OR_DEPARTMENT_OTHER): Payer: Self-pay

## 2021-12-03 ENCOUNTER — Encounter (HOSPITAL_BASED_OUTPATIENT_CLINIC_OR_DEPARTMENT_OTHER): Payer: 59 | Admitting: Advanced Practice Midwife

## 2021-12-05 ENCOUNTER — Telehealth: Payer: Self-pay | Admitting: Neurology

## 2021-12-05 NOTE — Telephone Encounter (Signed)
Pt's new insurance information [employer] Member ID [id number] Plan# [id number] RxBIN# [id number] PCN [id number] Would like a call back to confirm insurance will covers Botox.

## 2021-12-17 ENCOUNTER — Ambulatory Visit: Payer: 59 | Admitting: Neurology

## 2021-12-17 DIAGNOSIS — G43009 Migraine without aura, not intractable, without status migrainosus: Secondary | ICD-10-CM | POA: Insufficient documentation

## 2021-12-17 DIAGNOSIS — G43711 Chronic migraine without aura, intractable, with status migrainosus: Secondary | ICD-10-CM | POA: Diagnosis not present

## 2021-12-17 MED ORDER — NURTEC 75 MG PO TBDP
75.0000 mg | ORAL_TABLET | Freq: Every day | ORAL | 6 refills | Status: DC | PRN
Start: 2021-12-17 — End: 2022-03-27

## 2021-12-17 MED ORDER — ONABOTULINUMTOXINA 200 UNITS IJ SOLR
155.0000 [IU] | Freq: Once | INTRAMUSCULAR | Status: AC
  Administered 2021-12-17: 155 [IU] via INTRAMUSCULAR

## 2021-12-17 NOTE — Progress Notes (Signed)
Consent Form 12/17/2021: stable . Tried emgality, [place], [place], nurtec, aimovig, topiramate, propranolol caused hypotension, amitriptyline and multiple other meds (see office note) will try to get vyepti approved; vyepti. Botox has helped with >50% in severity and frequency was having daily severe migraines but still having 8 migraine days a month and >15 headache days a month     Botulism Toxin Injection For Chronic Migraine 09/24/2021: > 60% improvement in severity  07/02/2021: Stable 04/02/2021: Stable, > 60% improvement severity but not frequency try emgality too. 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 No traps or cervical muscles. See pics 12/25/2020: Stable doing great. Third botox. 09/25/2020: second botox.  Look at the pictures from this date.  No traps or cervical muscles.  She is allergic to nsaids cannot try cambia. Try to get eletriptan approved.  Reviewed orally with patient, additionally signature is on file:  Botulism toxin has been approved by the Federal drug administration for treatment of chronic migraine. Botulism toxin does not cure chronic migraine and it may not be effective in some patients.  The administration of botulism toxin is accomplished by injecting a small amount of toxin into the muscles of the neck and head. Dosage must be titrated for each individual. Any benefits resulting from botulism toxin tend to wear off after 3 months with a repeat injection required if benefit is to be maintained. Injections are usually done every 3-4 months with maximum effect peak achieved by about 2 or 3 weeks. Botulism toxin is expensive and you should be sure of what costs you will incur resulting from the injection.  The side effects of botulism toxin use for chronic migraine may include:   -Transient, and usually mild, facial weakness with facial injections  -Transient, and usually mild, head or neck weakness with head/neck injections  -Reduction or loss of forehead facial animation due to  forehead muscle weakness  -Eyelid drooping  -Dry eye  -Pain at the site of injection or bruising at the site of injection  -Double vision  -Potential unknown long term risks  Contraindications: You should not have Botox if you are pregnant, nursing, allergic to albumin, have an infection, skin condition, or muscle weakness at the site of the injection, or have myasthenia gravis, Lambert-Eaton syndrome, or ALS.  It is also possible that as with any injection, there may be an allergic reaction or no effect from the medication. Reduced effectiveness after repeated injections is sometimes seen and rarely infection at the injection site may occur. All care will be taken to prevent these side effects. If therapy is given over a long time, atrophy and wasting in the muscle injected may occur. Occasionally the patient's become refractory to treatment because they develop antibodies to the toxin. In this event, therapy needs to be modified.  I have read the above information and consent to the administration of botulism toxin.    BOTOX PROCEDURE NOTE FOR MIGRAINE HEADACHE    Contraindications and precautions discussed with patient(above). Aseptic procedure was observed and patient tolerated procedure. Procedure performed by Dr. Artemio Aly  The condition has existed for more than 6 months, and pt does not have a diagnosis of ALS, Myasthenia Gravis or Lambert-Eaton Syndrome.  Risks and benefits of injections discussed and pt agrees to proceed with the procedure.  Written consent obtained  These injections are medically necessary. Pt  receives good benefits from these injections. These injections do not cause sedations or hallucinations which the oral therapies may cause.  Description of procedure:  The patient  was placed in a sitting position. The standard protocol was used for Botox as follows, with 5 units of Botox injected at each site:   -Procerus muscle, midline injection  -Corrugator  muscle, bilateral injection  -Frontalis muscle, bilateral injection, with 2 sites each side, medial injection was performed in the upper one third of the frontalis muscle, in the region vertical from the medial inferior edge of the superior orbital rim. The lateral injection was again in the upper one third of the forehead vertically above the lateral limbus of the cornea, 1.5 cm lateral to the medial injection site.  -Temporalis muscle injection, 4 sites, bilaterally. The first injection was 3 cm above the tragus of the ear, second injection site was 1.5 cm to 3 cm up from the first injection site in line with the tragus of the ear. The third injection site was 1.5-3 cm forward between the first 2 injection sites. The fourth injection site was 1.5 cm posterior to the second injection site.   -Occipitalis muscle injection, 3 sites, bilaterally. The first injection was done one half way between the occipital protuberance and the tip of the mastoid process behind the ear. The second injection site was done lateral and superior to the first, 1 fingerbreadth from the first injection. The third injection site was 1 fingerbreadth superiorly and medially from the first injection site.  -Cervical paraspinal muscle injection, 2 sites, bilateral knee first injection site was 1 cm from the midline of the cervical spine, 3 cm inferior to the lower border of the occipital protuberance. The second injection site was 1.5 cm superiorly and laterally to the first injection site.  -Trapezius muscle injection was performed at 3 sites, bilaterally. The first injection site was in the upper trapezius muscle halfway between the inflection point of the neck, and the acromion. The second injection site was one half way between the acromion and the first injection site. The third injection was done between the first injection site and the inflection point of the neck.   Will return for repeat injection in 3 months.   155  units of Botox was used, 45U Botox not injected was wasted. The patient tolerated the procedure well, there were no complications of the above procedure.

## 2021-12-17 NOTE — Progress Notes (Addendum)
Botox- 200 units x 1 vial Lot: [id number] Expiration: 05/2024 NDC: 0023-3921-02  Bacteriostatic 0.9% Sodium Chloride- 4mL total Lot: [id number] Expiration: 11/27/2022 NDC: 0409-1966-02  Dx: G43.711 S/P   

## 2021-12-19 ENCOUNTER — Telehealth: Payer: Self-pay | Admitting: *Deleted

## 2021-12-19 ENCOUNTER — Telehealth: Payer: Self-pay | Admitting: Neurology

## 2021-12-19 MED ORDER — RIZATRIPTAN BENZOATE 10 MG PO TABS: ORAL_TABLET | ORAL | 5 refills | Status: DC

## 2021-12-19 NOTE — Telephone Encounter (Signed)
Pt is needing a refill request for her  rizatriptan (MAXALT) 10 MG tablet and her UBRELVY 100 MG TABS Pt is needing them sent to the [facility] on [address] for now.

## 2021-12-19 NOTE — Telephone Encounter (Signed)
-----   Message from Anson Fret, MD sent at 12/17/2021  3:11 PM EDT ----- Regarding: vyepti Tried emgality, [place], nurtec, aimovig, qulipta, topiramate, propranolol contraindicated due to hypotension, amitriptyline and multiple other meds (see office note) please try to get vyepti approved; [place] Botox has helped with >50% in severity and frequency was having daily severe migraines but now having 8 migraine days a month and >15 headache days a month  Please try to get vyepti approved in addition to botox. But she wants to continue botox and see if we can add the vyepti.

## 2021-12-19 NOTE — Telephone Encounter (Signed)
Refills sent to [facility] on [address].

## 2021-12-19 NOTE — Telephone Encounter (Signed)
Order sheet completed for intrafusion.  Needs signature, then will get to intrafusion to start process.

## 2022-01-02 NOTE — Telephone Encounter (Addendum)
[employer] botox form signed then faxed to plan along with office notes. Received a receipt of confirmation.  SP will be [employer], [employer] in [place]. Patient has not been scheduled yet since we do not have an approval.

## 2022-01-02 NOTE — Telephone Encounter (Signed)
[employer] Botox auth form completed and is pending MD signature. Spoke with patient. She stated she tried Amitriptyline in the past for 6 months, Topamax for 8 months, and Metoprolol for a year. She would also like to discuss her account with billing.

## 2022-01-05 IMAGING — DX DG ABDOMEN 1V
1 series · 1 of 1 positions shown · non-contrast
Comparison: None.

CLINICAL DATA: Constipation

EXAM:
ABDOMEN - 1 VIEW

[abdomen kub]
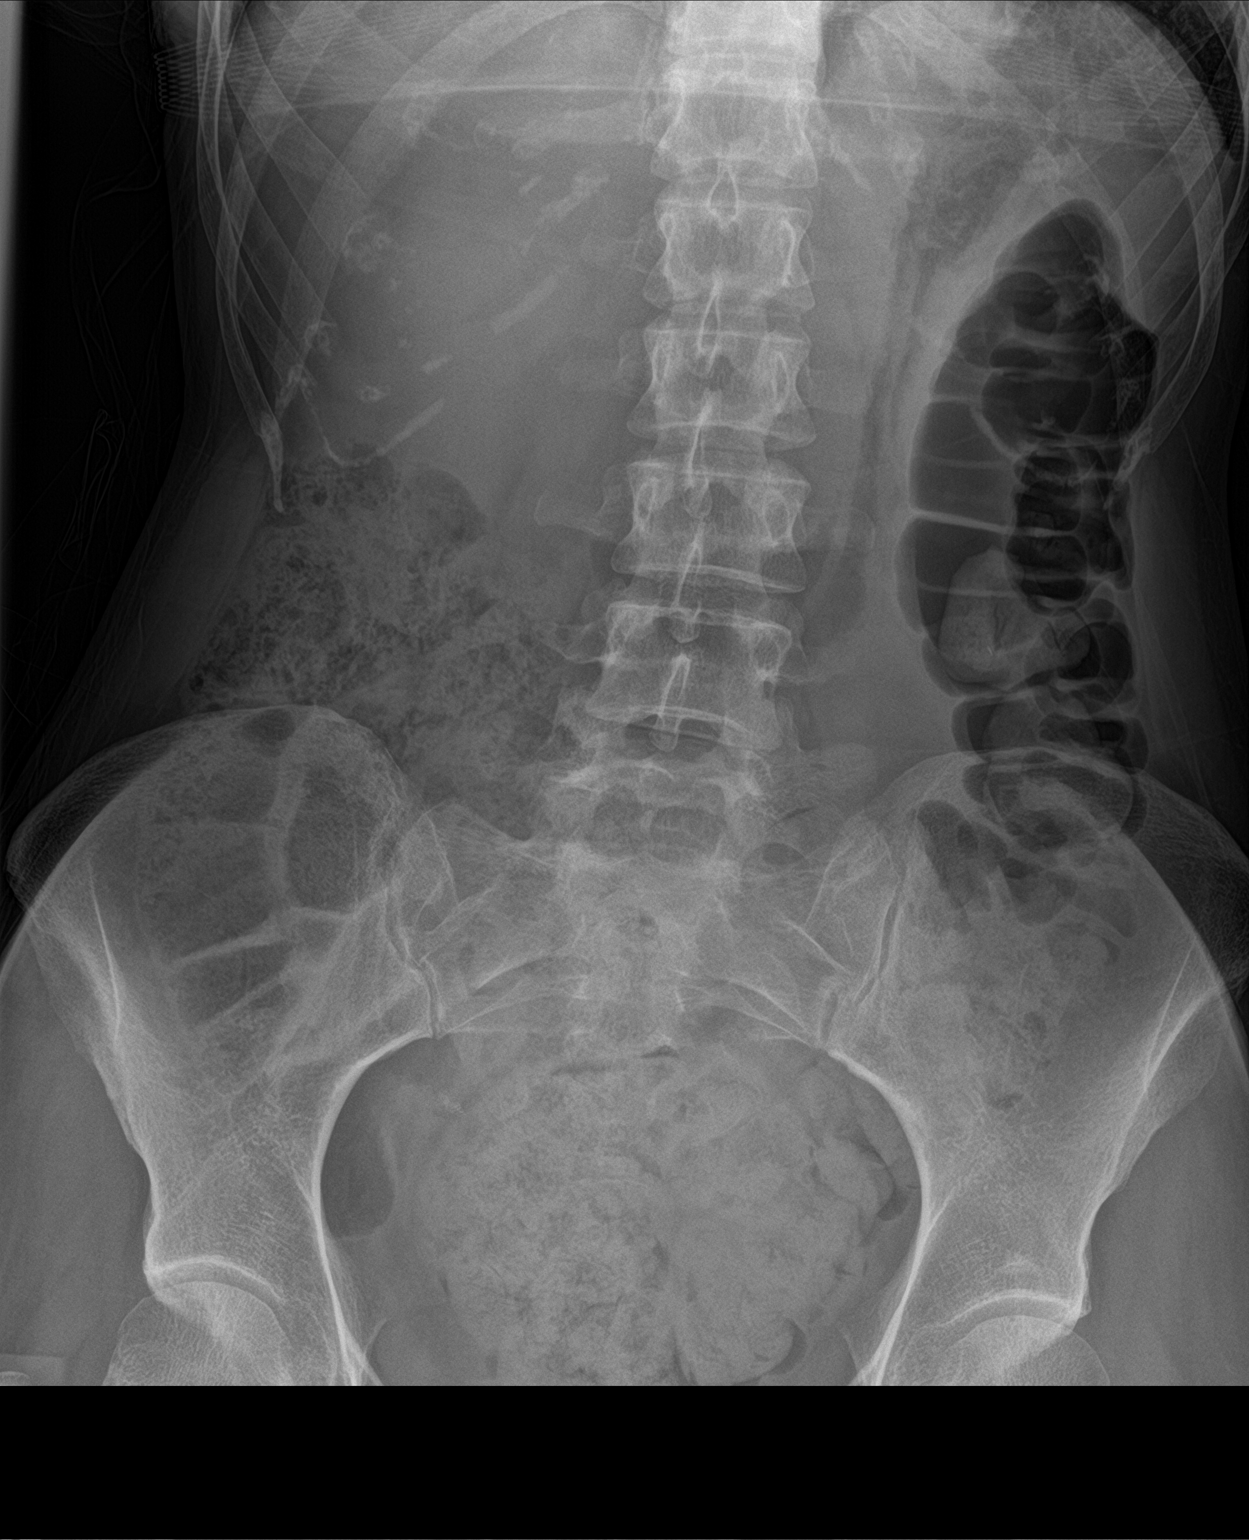

[1 of 1 positions shown; findings below may reference images not displayed]

FINDINGS: Scattered large and small bowel gas is noted. Fecal material is
noted throughout the colon consistent with constipation. No
obstructive changes are seen. No bony noted.
IMPRESSION: Changes consistent with constipation.

## 2022-01-06 NOTE — Telephone Encounter (Signed)
Received fax from pharmacy stating PA denied because [facility] is out of network.

## 2022-01-06 NOTE — Telephone Encounter (Signed)
Fax received from [facility] confirming receipt of PA. Monia Pouch will contact our office if additional information is needed.

## 2022-01-07 NOTE — Telephone Encounter (Signed)
I called [employer]. Was transferred multiple times and spent at least 30 minutes on the calls. I spoke with Ed C (reference [id number]) and he told me [facility] is a preferred pharmacy. I also spoke with Trinna Post (reference [id number]) and he confirmed [id number] does not require pre-certification.   New [employer] Botox PA form has been completed with [facility] on there. Form ready for MD signature.

## 2022-01-08 DIAGNOSIS — G43909 Migraine, unspecified, not intractable, without status migrainosus: Secondary | ICD-10-CM | POA: Diagnosis not present

## 2022-01-08 DIAGNOSIS — L309 Dermatitis, unspecified: Secondary | ICD-10-CM | POA: Diagnosis not present

## 2022-01-08 DIAGNOSIS — E538 Deficiency of other specified B group vitamins: Secondary | ICD-10-CM | POA: Diagnosis not present

## 2022-01-08 DIAGNOSIS — R69 Illness, unspecified: Secondary | ICD-10-CM | POA: Diagnosis not present

## 2022-01-08 DIAGNOSIS — G4700 Insomnia, unspecified: Secondary | ICD-10-CM | POA: Diagnosis not present

## 2022-01-13 NOTE — Telephone Encounter (Signed)
We received a fax from [place] stating [phone] has been denied and "The requested procedure or service is excluded from coverage under the terms of the member's plan".   I called [employer] pre-cert line and spoke with June Leap. She will obtain further information and call me back.

## 2022-01-15 NOTE — Telephone Encounter (Signed)
Called pt and let her know I am waiting to hear back from [facility] regarding Botox denial. I also encouraged the pt to let me know if she finds anything out from them as well. She is going to research the Botox savings program for options when insurance denies the Botox. She verbalized appreciation for the call.

## 2022-01-22 DIAGNOSIS — D2272 Melanocytic nevi of left lower limb, including hip: Secondary | ICD-10-CM | POA: Diagnosis not present

## 2022-01-22 DIAGNOSIS — D2271 Melanocytic nevi of right lower limb, including hip: Secondary | ICD-10-CM | POA: Diagnosis not present

## 2022-01-22 DIAGNOSIS — D2261 Melanocytic nevi of right upper limb, including shoulder: Secondary | ICD-10-CM | POA: Diagnosis not present

## 2022-01-22 DIAGNOSIS — D2262 Melanocytic nevi of left upper limb, including shoulder: Secondary | ICD-10-CM | POA: Diagnosis not present

## 2022-01-22 DIAGNOSIS — D225 Melanocytic nevi of trunk: Secondary | ICD-10-CM | POA: Diagnosis not present

## 2022-01-22 DIAGNOSIS — L814 Other melanin hyperpigmentation: Secondary | ICD-10-CM | POA: Diagnosis not present

## 2022-01-22 DIAGNOSIS — D1801 Hemangioma of skin and subcutaneous tissue: Secondary | ICD-10-CM | POA: Diagnosis not present

## 2022-01-22 NOTE — Telephone Encounter (Signed)
I was unable to get in touch with anyone directly in the [facility] appeals department. I spoke with Joanell Rising from [place] [phone] and she was able to complete an IBR (internal benefits review) request to determine medical necessity and she said they would get back in touch with [place] within 24-48 hours. She gave a PA number of [id number]. Botox is not a covered benefit but hopefully they will allow a plan exclusion.

## 2022-02-03 ENCOUNTER — Telehealth: Payer: Self-pay | Admitting: Pharmacy Technician

## 2022-02-03 ENCOUNTER — Other Ambulatory Visit (HOSPITAL_COMMUNITY): Payer: Self-pay

## 2022-02-03 DIAGNOSIS — G43711 Chronic migraine without aura, intractable, with status migrainosus: Secondary | ICD-10-CM

## 2022-02-03 NOTE — Telephone Encounter (Signed)
Received fax from [employer] confirming receipt of PA request.

## 2022-02-03 NOTE — Telephone Encounter (Signed)
Chronic Migraine CPT 64615  Botox J0585 Units:200  G43.711 Chronic Migraine without aura, intractable, with status migrainous  Can the prior auth team look into this? Patient switched to [facility] and I have had a difficult time trying to get her Botox approved. It's looking like its not a covered benefit. Someone from [facility] put in an IBR request on 01/23/22 but we haven't heard back yet.

## 2022-02-03 NOTE — Telephone Encounter (Signed)
Patient Advocate Encounter   Received notification that prior authorization for Botox 200UNIT solution is required.   PA submitted on 02/03/2022 Key [id number] Status is pending       Lyndel Safe, [place] Patient Advocate Specialist [place] Patient Advocate Team Direct Number: [phone]  Fax: [phone]

## 2022-02-04 NOTE — Telephone Encounter (Signed)
Received fax from [place] stating [phone] Botox has been approved.   Case number: [id number] Authorization period: 02/03/2022 - 02/03/2023

## 2022-02-05 MED ORDER — BOTOX 200 UNITS IJ SOLR: INTRAMUSCULAR | 3 refills | Status: AC

## 2022-02-05 NOTE — Addendum Note (Signed)
Addended by: Gildardo Griffes on: 02/05/2022 04:39 PM   Modules accepted: Orders

## 2022-02-05 NOTE — Telephone Encounter (Signed)
Pt scheduled for botox with Dr. Jaynee Eagles on 03/17/22 at 3:00pm

## 2022-02-05 NOTE — Telephone Encounter (Addendum)
Per Merry Proud w/ PA team, [phone] does not need PA. [facility] is the SP we should use. I have sent a prescription to [facility] and our office will call to get the patient scheduled for her next injection on or after 03/11/2022 (last injection was 12/17/21).

## 2022-03-17 ENCOUNTER — Ambulatory Visit: Payer: 59 | Admitting: Neurology

## 2022-03-17 DIAGNOSIS — G43711 Chronic migraine without aura, intractable, with status migrainosus: Secondary | ICD-10-CM | POA: Diagnosis not present

## 2022-03-17 MED ORDER — ONABOTULINUMTOXINA 200 UNITS IJ SOLR
155.0000 [IU] | Freq: Once | INTRAMUSCULAR | Status: AC
  Administered 2022-03-17: 155 [IU] via INTRAMUSCULAR

## 2022-03-17 NOTE — Progress Notes (Signed)
Consent Form  03/17/2022: > 60% improvement in severity  12/17/2021: stable . Tried emgality, [place], [place], nurtec, aimovig, topiramate, propranolol caused hypotension, amitriptyline and multiple other meds (see office note) will try to get vyepti approved; vyepti. Botox has helped with >50% in severity and frequency was having daily severe migraines but still having 8 migraine days a month and >15 headache days a month     Botulism Toxin Injection For Chronic Migraine 09/24/2021: > 60% improvement in severity  07/02/2021: Stable 04/02/2021: Stable, > 60% improvement severity but not frequency try emgality too. 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 No traps or cervical muscles. See pics 12/25/2020: Stable doing great. Third botox. 09/25/2020: second botox.  Look at the pictures from this date.  No traps or cervical muscles.  She is allergic to nsaids cannot try [place]ia. Try to get eletriptan approved.  Reviewed orally with patient, additionally signature is on file:  Botulism toxin has been approved by the Federal drug administration for treatment of chronic migraine. Botulism toxin does not cure chronic migraine and it may not be effective in some patients.  The administration of botulism toxin is accomplished by injecting a small amount of toxin into the muscles of the neck and head. Dosage must be titrated for each individual. Any benefits resulting from botulism toxin tend to wear off after 3 months with a repeat injection required if benefit is to be maintained. Injections are usually done every 3-4 months with maximum effect peak achieved by about 2 or 3 weeks. Botulism toxin is expensive and you should be sure of what costs you will incur resulting from the injection.  The side effects of botulism toxin use for chronic migraine may include:   -Transient, and usually mild, facial weakness with facial injections  -Transient, and usually mild, head or neck weakness with head/neck injections  -Reduction or  loss of forehead facial animation due to forehead muscle weakness  -Eyelid drooping  -Dry eye  -Pain at the site of injection or bruising at the site of injection  -Double vision  -Potential unknown long term risks  Contraindications: You should not have Botox if you are pregnant, nursing, allergic to albumin, have an infection, skin condition, or muscle weakness at the site of the injection, or have myasthenia gravis, Lambert-Eaton syndrome, or ALS.  It is also possible that as with any injection, there may be an allergic reaction or no effect from the medication. Reduced effectiveness after repeated injections is sometimes seen and rarely infection at the injection site may occur. All care will be taken to prevent these side effects. If therapy is given over a long time, atrophy and wasting in the muscle injected may occur. Occasionally the patient's become refractory to treatment because they develop antibodies to the toxin. In this event, therapy needs to be modified.  I have read the above information and consent to the administration of botulism toxin.    BOTOX PROCEDURE NOTE FOR MIGRAINE HEADACHE    Contraindications and precautions discussed with patient(above). Aseptic procedure was observed and patient tolerated procedure. Procedure performed by Dr. Artemio Aly  The condition has existed for more than 6 months, and pt does not have a diagnosis of ALS, Myasthenia Gravis or Lambert-Eaton Syndrome.  Risks and benefits of injections discussed and pt agrees to proceed with the procedure.  Written consent obtained  These injections are medically necessary. Pt  receives good benefits from these injections. These injections do not cause sedations or hallucinations which the oral therapies may  cause.  Description of procedure:  The patient was placed in a sitting position. The standard protocol was used for Botox as follows, with 5 units of Botox injected at each site:   -Procerus  muscle, midline injection  -Corrugator muscle, bilateral injection  -Frontalis muscle, bilateral injection, with 2 sites each side, medial injection was performed in the upper one third of the frontalis muscle, in the region vertical from the medial inferior edge of the superior orbital rim. The lateral injection was again in the upper one third of the forehead vertically above the lateral limbus of the cornea, 1.5 cm lateral to the medial injection site.  -Temporalis muscle injection, 4 sites, bilaterally. The first injection was 3 cm above the tragus of the ear, second injection site was 1.5 cm to 3 cm up from the first injection site in line with the tragus of the ear. The third injection site was 1.5-3 cm forward between the first 2 injection sites. The fourth injection site was 1.5 cm posterior to the second injection site.   -Occipitalis muscle injection, 3 sites, bilaterally. The first injection was done one half way between the occipital protuberance and the tip of the mastoid process behind the ear. The second injection site was done lateral and superior to the first, 1 fingerbreadth from the first injection. The third injection site was 1 fingerbreadth superiorly and medially from the first injection site.  -Cervical paraspinal muscle injection, 2 sites, bilateral knee first injection site was 1 cm from the midline of the cervical spine, 3 cm inferior to the lower border of the occipital protuberance. The second injection site was 1.5 cm superiorly and laterally to the first injection site.  -Trapezius muscle injection was performed at 3 sites, bilaterally. The first injection site was in the upper trapezius muscle halfway between the inflection point of the neck, and the acromion. The second injection site was one half way between the acromion and the first injection site. The third injection was done between the first injection site and the inflection point of the neck.   Will return for  repeat injection in 3 months.   155 units of Botox was used, 45U Botox not injected was wasted. The patient tolerated the procedure well, there were no complications of the above procedure.

## 2022-03-17 NOTE — Progress Notes (Signed)
Botox- 200 units x 1 vial Lot: [id number] Expiration: 06/2024 NDC: 0023-3921-02  Bacteriostatic 0.9% Sodium Chloride- 4mL total Lot: [id number] Expiration: 12/28/2022 NDC: 0409-1966-02  Dx: G43.711 S/P  

## 2022-03-19 ENCOUNTER — Encounter: Payer: Self-pay | Admitting: *Deleted

## 2022-03-27 ENCOUNTER — Other Ambulatory Visit: Payer: Self-pay | Admitting: *Deleted

## 2022-03-27 DIAGNOSIS — G43711 Chronic migraine without aura, intractable, with status migrainosus: Secondary | ICD-10-CM | POA: Diagnosis not present

## 2022-03-28 ENCOUNTER — Encounter: Payer: Self-pay | Admitting: Neurology

## 2022-04-08 DIAGNOSIS — R69 Illness, unspecified: Secondary | ICD-10-CM | POA: Diagnosis not present

## 2022-04-08 DIAGNOSIS — J309 Allergic rhinitis, unspecified: Secondary | ICD-10-CM | POA: Diagnosis not present

## 2022-04-08 DIAGNOSIS — G4700 Insomnia, unspecified: Secondary | ICD-10-CM | POA: Diagnosis not present

## 2022-04-11 DIAGNOSIS — H1031 Unspecified acute conjunctivitis, right eye: Secondary | ICD-10-CM | POA: Diagnosis not present

## 2022-04-11 DIAGNOSIS — Z681 Body mass index (BMI) 19 or less, adult: Secondary | ICD-10-CM | POA: Diagnosis not present

## 2022-04-15 ENCOUNTER — Encounter: Payer: Self-pay | Admitting: *Deleted

## 2022-05-27 DIAGNOSIS — Z01419 Encounter for gynecological examination (general) (routine) without abnormal findings: Secondary | ICD-10-CM | POA: Diagnosis not present

## 2022-05-27 DIAGNOSIS — Z1231 Encounter for screening mammogram for malignant neoplasm of breast: Secondary | ICD-10-CM | POA: Diagnosis not present

## 2022-06-16 ENCOUNTER — Encounter: Payer: Self-pay | Admitting: Adult Health

## 2022-06-16 ENCOUNTER — Ambulatory Visit: Payer: 59 | Admitting: Adult Health

## 2022-06-16 DIAGNOSIS — G43711 Chronic migraine without aura, intractable, with status migrainosus: Secondary | ICD-10-CM | POA: Diagnosis not present

## 2022-06-16 MED ORDER — ONABOTULINUMTOXINA 200 UNITS IJ SOLR
155.0000 [IU] | Freq: Once | INTRAMUSCULAR | Status: AC
  Administered 2022-06-16: 155 [IU] via INTRAMUSCULAR

## 2022-06-16 NOTE — Progress Notes (Signed)
Update 06/16/2022 JM: Patient returns for repeat Botox.  Prior Botox 03/17/2022 with Dr. Jaynee Eagles.  She continues to experience 2-3 migraines per week but this is unchanged since prior visit. Does notice increased migraine frequency a few weeks prior to repeat injection. Migraines primarily affect left side.  Tolerated procedure well today. Will return in 3 months for repeat injection.        Consent Form Botulism Toxin Injection For Chronic Migraine    Reviewed orally with patient, additionally signature is on file:  Botulism toxin has been approved by the Federal drug administration for treatment of chronic migraine. Botulism toxin does not cure chronic migraine and it may not be effective in some patients.  The administration of botulism toxin is accomplished by injecting a small amount of toxin into the muscles of the neck and head. Dosage must be titrated for each individual. Any benefits resulting from botulism toxin tend to wear off after 3 months with a repeat injection required if benefit is to be maintained. Injections are usually done every 3-4 months with maximum effect peak achieved by about 2 or 3 weeks. Botulism toxin is expensive and you should be sure of what costs you will incur resulting from the injection.  The side effects of botulism toxin use for chronic migraine may include:   -Transient, and usually mild, facial weakness with facial injections  -Transient, and usually mild, head or neck weakness with head/neck injections  -Reduction or loss of forehead facial animation due to forehead muscle weakness  -Eyelid drooping  -Dry eye  -Pain at the site of injection or bruising at the site of injection  -Double vision  -Potential unknown long term risks   Contraindications: You should not have Botox if you are pregnant, nursing, allergic to albumin, have an infection, skin condition, or muscle weakness at the site of the injection, or have myasthenia gravis,  Lambert-Eaton syndrome, or ALS.  It is also possible that as with any injection, there may be an allergic reaction or no effect from the medication. Reduced effectiveness after repeated injections is sometimes seen and rarely infection at the injection site may occur. All care will be taken to prevent these side effects. If therapy is given over a long time, atrophy and wasting in the muscle injected may occur. Occasionally the patient's become refractory to treatment because they develop antibodies to the toxin. In this event, therapy needs to be modified.  I have read the above information and consent to the administration of botulism toxin.    BOTOX PROCEDURE NOTE FOR MIGRAINE HEADACHE  Contraindications and precautions discussed with patient(above). Aseptic procedure was observed and patient tolerated procedure. Procedure performed by Frann Rider, AGNP-BC.   The condition has existed for more than 6 months, and pt does not have a diagnosis of ALS, Myasthenia Gravis or Lambert-Eaton Syndrome.  Risks and benefits of injections discussed and pt agrees to proceed with the procedure.  Written consent obtained  These injections are medically necessary. Pt  receives good benefits from these injections. These injections do not cause sedations or hallucinations which the oral therapies may cause.   Description of procedure:  The patient was placed in a sitting position. The standard protocol was used for Botox as follows, with 5 units of Botox injected at each site:  -Procerus muscle, midline injection  -Corrugator muscle, bilateral injection  -Frontalis muscle, bilateral injection, with 2 sites each side, medial injection was performed in the upper one third of the frontalis  muscle, in the region vertical from the medial inferior edge of the superior orbital rim. The lateral injection was again in the upper one third of the forehead vertically above the lateral limbus of the cornea, 1.5 cm  lateral to the medial injection site.  -Temporalis muscle injection, 4 sites, left side (per pt request) with 10 units in each spot. The first injection was 3 cm above the tragus of the ear, second injection site was 1.5 cm to 3 cm up from the first injection site in line with the tragus of the ear. The third injection site was 1.5-3 cm forward between the first 2 injection sites. The fourth injection site was 1.5 cm posterior to the second injection site. 5th site laterally in the temporalis  muscleat the level of the outer canthus.  -Occipitalis muscle injection, 3 sites, bilaterally. The first injection was done one half way between the occipital protuberance and the tip of the mastoid process behind the ear. The second injection site was done lateral and superior to the first, 1 fingerbreadth from the first injection. The third injection site was 1 fingerbreadth superiorly and medially from the first injection site.  -Cervical paraspinal muscle injection, 2 sites, bilaterally. The first injection site was 1 cm from the midline of the cervical spine, 3 cm inferior to the lower border of the occipital protuberance. The second injection site was 1.5 cm superiorly and laterally to the first injection site.  -Trapezius muscle injection was performed at 3 sites, left side (per pt request). The first injection site was in the upper trapezius muscle halfway between the inflection point of the neck, and the acromion. The second injection site was one half way between the acromion and the first injection site. The third injection was done between the first injection site and the inflection point of the neck.    A total of 200 units of Botox was prepared, 140 units of Botox was injected as documented above, any Botox not injected was wasted. The patient tolerated the procedure well, there were no complications of the above procedure.   Frann Rider, AGNP-BC  [facility] [address] [place], [place] [address]  Phone [phone] Fax [phone] Note: This document was prepared with digital dictation and possible smart phrase technology. Any transcriptional errors that result from this process are unintentional.

## 2022-06-16 NOTE — Progress Notes (Signed)
Botox- 200 units x 1 vial Lot: [id number] Expiration: 08/2024 NDC: [id number]   Bacteriostatic 0.9% Sodium Chloride- 31m total [id number]: 11/25 NDC: [id number]  Dx: [id number]

## 2022-07-02 ENCOUNTER — Other Ambulatory Visit: Payer: Self-pay | Admitting: Neurology

## 2022-07-10 DIAGNOSIS — F900 Attention-deficit hyperactivity disorder, predominantly inattentive type: Secondary | ICD-10-CM | POA: Diagnosis not present

## 2022-07-10 DIAGNOSIS — G4700 Insomnia, unspecified: Secondary | ICD-10-CM | POA: Diagnosis not present

## 2022-07-10 DIAGNOSIS — G43909 Migraine, unspecified, not intractable, without status migrainosus: Secondary | ICD-10-CM | POA: Diagnosis not present

## 2022-09-10 ENCOUNTER — Encounter: Payer: Self-pay | Admitting: Neurology

## 2022-09-10 ENCOUNTER — Ambulatory Visit: Payer: 59 | Admitting: Family Medicine

## 2022-09-11 NOTE — Telephone Encounter (Signed)
FYI- pt has bad debt, advised her to call and speak with billing.

## 2022-09-19 ENCOUNTER — Telehealth: Payer: Self-pay

## 2022-09-19 ENCOUNTER — Other Ambulatory Visit (HOSPITAL_COMMUNITY): Payer: Self-pay

## 2022-09-19 NOTE — Telephone Encounter (Signed)
Patient Advocate Encounter   Received notification from [employer] that prior authorization is required for Ubrelvy 100MG  tablets   Submitted: 09-19-2022 Key [id number]  Status is pending

## 2022-09-20 ENCOUNTER — Other Ambulatory Visit (HOSPITAL_COMMUNITY): Payer: Self-pay

## 2022-09-21 ENCOUNTER — Other Ambulatory Visit (HOSPITAL_COMMUNITY): Payer: Self-pay

## 2022-09-21 NOTE — Telephone Encounter (Signed)
Pharmacy Patient Advocate Encounter  Prior Authorization for Ubrelvy 100MG  tablets has been approved by [employer] (ins).    PA # PA Case ID #: [id number] Effective dates: 09/19/2022 through 09/19/2023

## 2022-10-09 ENCOUNTER — Other Ambulatory Visit: Payer: Self-pay | Admitting: Neurology

## 2022-10-13 ENCOUNTER — Other Ambulatory Visit: Payer: Self-pay | Admitting: *Deleted

## 2022-10-13 NOTE — Telephone Encounter (Signed)
Error.  Did in rx refills.

## 2022-10-16 DIAGNOSIS — G43909 Migraine, unspecified, not intractable, without status migrainosus: Secondary | ICD-10-CM | POA: Diagnosis not present

## 2022-10-16 DIAGNOSIS — F900 Attention-deficit hyperactivity disorder, predominantly inattentive type: Secondary | ICD-10-CM | POA: Diagnosis not present

## 2022-11-04 DIAGNOSIS — Z681 Body mass index (BMI) 19 or less, adult: Secondary | ICD-10-CM | POA: Diagnosis not present

## 2022-11-04 DIAGNOSIS — K120 Recurrent oral aphthae: Secondary | ICD-10-CM | POA: Diagnosis not present

## 2023-01-19 DIAGNOSIS — F900 Attention-deficit hyperactivity disorder, predominantly inattentive type: Secondary | ICD-10-CM | POA: Diagnosis not present

## 2023-01-19 DIAGNOSIS — T7840XA Allergy, unspecified, initial encounter: Secondary | ICD-10-CM | POA: Diagnosis not present

## 2023-01-19 DIAGNOSIS — G4700 Insomnia, unspecified: Secondary | ICD-10-CM | POA: Diagnosis not present

## 2023-01-19 DIAGNOSIS — G43909 Migraine, unspecified, not intractable, without status migrainosus: Secondary | ICD-10-CM | POA: Diagnosis not present

## 2023-04-13 DIAGNOSIS — F900 Attention-deficit hyperactivity disorder, predominantly inattentive type: Secondary | ICD-10-CM | POA: Diagnosis not present

## 2023-04-13 DIAGNOSIS — G43909 Migraine, unspecified, not intractable, without status migrainosus: Secondary | ICD-10-CM | POA: Diagnosis not present

## 2023-04-13 DIAGNOSIS — G4700 Insomnia, unspecified: Secondary | ICD-10-CM | POA: Diagnosis not present

## 2023-09-07 ENCOUNTER — Telehealth: Payer: Self-pay

## 2023-09-07 ENCOUNTER — Other Ambulatory Visit (HOSPITAL_COMMUNITY): Payer: Self-pay

## 2023-09-07 NOTE — Telephone Encounter (Signed)
 Pharmacy Patient Advocate Encounter   Received notification from CoverMyMeds that prior authorization for Ubrelvy  100MG  tablets is required/requested.   Insurance verification completed.   The patient is insured through [employer] .   Per test claim: The current 30 day co-pay is, $0.00.  No PA needed at this time. This test claim was processed through [facility]- copay amounts may vary at other pharmacies due to pharmacy/plan contracts, or as the patient moves through the different stages of their insurance plan.    Maximum of 16 tablets per 30 days

## 2024-02-18 ENCOUNTER — Emergency Department (HOSPITAL_BASED_OUTPATIENT_CLINIC_OR_DEPARTMENT_OTHER)
Admission: EM | Admit: 2024-02-18 | Discharge: 2024-02-18 | Disposition: A | Discharge status: Home / Self Care | Attending: Emergency Medicine | Admitting: Emergency Medicine

## 2024-02-18 DIAGNOSIS — T7840XA Allergy, unspecified, initial encounter: Secondary | ICD-10-CM | POA: Diagnosis not present

## 2024-02-18 DIAGNOSIS — R519 Headache, unspecified: Secondary | ICD-10-CM

## 2024-02-18 MED ORDER — PROCHLORPERAZINE EDISYLATE 10 MG/2ML IJ SOLN
10.0000 mg | Freq: Once | INTRAMUSCULAR | Status: AC
  Administered 2024-02-18: 10 mg via INTRAVENOUS
  Filled 2024-02-18: qty 2

## 2024-02-18 MED ORDER — DEXAMETHASONE SOD PHOSPHATE PF 10 MG/ML IJ SOLN
10.0000 mg | Freq: Once | INTRAMUSCULAR | Status: AC
  Administered 2024-02-18: 10 mg via INTRAVENOUS

## 2024-02-18 MED ORDER — SODIUM CHLORIDE 0.9 % IV BOLUS
1000.0000 mL | Freq: Once | INTRAVENOUS | Status: AC
  Administered 2024-02-18: 1000 mL via INTRAVENOUS

## 2024-02-18 MED ORDER — DIPHENHYDRAMINE HCL 50 MG/ML IJ SOLN
12.5000 mg | Freq: Once | INTRAMUSCULAR | Status: AC
  Administered 2024-02-18: 12.5 mg via INTRAVENOUS
  Filled 2024-02-18: qty 1

## 2024-02-18 NOTE — ED Triage Notes (Addendum)
 Stung by bee in [place] TM cup. Stung in lip-30 min PTA. Minor swelling to lower lip. Denies itching, throat soreness. Migraine since 1700.

## 2024-02-18 NOTE — ED Provider Notes (Signed)
 [place] EMERGENCY DEPARTMENT AT [facility] Provider Note   CSN: [id number] Arrival date & time: 02/18/24  2012    Patient presents with: Headache, allergic reaction   Emma Miller is a 43 y.o. female for evaluation of allergic reaction and headache.  Patient went to drink out of a [employer] cup when there was a bee and it bit her to the inner aspect of her left lip about 30 minutes PTA.  She noted some swelling to her lip.  She did not take any medications PTA.  She also noted that she has had a migraine since 5:00.  History of similar.  Feels like her typical migraine.  She is on multiple medications in the outpatient setting for her migraines.  No sudden onset thunderclap headache, numbness, weakness, diplopia.  No hx of clots   Follows with [facility] neurology   HPI     Prior to Admission medications   Medication Sig Start Date End Date Taking? Authorizing Provider  ADDERALL XR 30 MG 24 hr capsule Take 30 mg by mouth every morning. 07/18/14   [provider]  amphetamine-dextroamphetamine (ADDERALL) 20 MG tablet Take 20 mg by mouth. afternoon 11/28/13 06/14/20  [provider]  baclofen (LIORESAL) 10 MG tablet Take 10 mg by mouth at bedtime as needed. 06/01/20   [provider]  botulinum toxin Type A  (BOTOX ) 200 units injection Provider to inject 155 units into the muscles of the head and neck every 12 weeks. Discard remainder. 02/05/22   Ines Onetha NOVAK, MD  Cyanocobalamin (VITAMIN B-12 PO) Take by mouth.    [provider]  fexofenadine (ALLEGRA) 180 MG tablet Take 180 mg by mouth daily.    [provider]  fluticasone (FLONASE) 50 MCG/ACT nasal spray Place into the nose. 01/09/17   [provider]  Galcanezumab -gnlm (EMGALITY ) 120 MG/ML SOAJ Inject 120 mg into the skin every 30 (thirty) days. 07/02/21   Ines Onetha NOVAK, MD  Lasmiditan  Succinate (REYVOW ) 100 MG TABS Take 100 mg by mouth once as needed for up to 1 dose.  09/25/20   Ines Onetha NOVAK, MD  norethindrone-ethinyl estradiol (JUNEL FE,GILDESS FE,LOESTRIN FE) 1-20 MG-MCG tablet Take 1 tablet by mouth. 07/25/13   [provider]  OnabotulinumtoxinA  (BOTOX  IJ) Inject as directed every 3 (three) months.    [provider]  rizatriptan  (MAXALT ) 10 MG tablet TAKE 1 TABLET BY MOUTH AT ONSET OF HEADACHE; MAY REPEAT 1 TABLET IN 2 HOURS IF NEEDED. MAX 2 TABLETS PER 24 HOURS 10/13/22   Ines Onetha NOVAK, MD  SUMAtriptan  (IMITREX ) 100 MG tablet Take 1 tablet (100 mg total) by mouth every 2 (two) hours as needed for migraine. Max 2 doses of ANY triptan daily. 08/27/21   Ines Onetha NOVAK, MD  SUMAtriptan  (TOSYMRA ) 10 MG/ACT SOLN Place 1 spray into the nose every 1 hour x 3 doses for 3 doses. 09/25/20 09/26/20  Ines Onetha NOVAK, MD  SUMAtriptan  Succinate (ZEMBRACE SYMTOUCH ) 3 MG/0.5ML SOAJ Inject 3 mg into the skin once as needed for up to 1 dose. May repeat in 15 minutes. If symptoms persist, repeat in 2 hours. Max 4 injections daily. 09/25/20   Ines Onetha NOVAK, MD  UBRELVY  100 MG TABS TAKE 1 TABLET BY MOUTH EVERY 2 HOURS AS NEEDED. MAXIMUM 200 MG A DAY 06/24/21   Ines Onetha NOVAK, MD  zolpidem (AMBIEN) 5 MG tablet Take 5 mg by mouth at bedtime.    [provider]    Allergies: Doxycycline and  Nsaids    Review of Systems  Constitutional: Negative.   HENT:  Positive for facial swelling.   Respiratory: Negative.    Cardiovascular: Negative.   Gastrointestinal: Negative.   Genitourinary: Negative.   Musculoskeletal: Negative.   Skin: Negative.   Neurological:  Positive for headaches. Negative for weakness and numbness.  All other systems reviewed and are negative.   Updated Vital Signs BP (!) 137/90 (BP Location: Right Arm)   Pulse 73   Temp 98.5 F (36.9 C)   Resp 17   SpO2 100%   Physical Exam Vitals and nursing note reviewed.  Constitutional:      General: She is not in acute distress.    Appearance: She is well-developed. She is  not ill-appearing, toxic-appearing or diaphoretic.  HENT:     Head: Normocephalic and atraumatic.     Jaw: There is normal jaw occlusion.     Comments: Left lower lip edema, inner left lip with punctate erythema consistent with sting no fluctuance, induration, bruising.  Posterior pharynx clear.    Nose: Nose normal.     Mouth/Throat:     Mouth: Mucous membranes are moist.  Eyes:     Pupils: Pupils are equal, round, and reactive to light.  Cardiovascular:     Rate and Rhythm: Normal rate.     Pulses: Normal pulses.     Heart sounds: Normal heart sounds.  Pulmonary:     Effort: Pulmonary effort is normal. No respiratory distress.     Breath sounds: Normal breath sounds.  Abdominal:     General: There is no distension.  Musculoskeletal:        General: Normal range of motion.     Cervical back: Normal range of motion.  Skin:    General: Skin is warm and dry.  Neurological:     General: No focal deficit present.     Mental Status: She is alert.     Cranial Nerves: Cranial nerves 2-12 are intact.     Sensory: Sensation is intact.     Motor: Motor function is intact.     Coordination: Coordination is intact.     Gait: Gait is intact.  Psychiatric:        Mood and Affect: Mood normal.     (all labs ordered are listed, but only abnormal results are displayed) Labs Reviewed - No data to display  EKG: None  Radiology: No results found.   Procedures   Medications Ordered in the ED  prochlorperazine (COMPAZINE) injection 10 mg (10 mg Intravenous Given 02/18/24 2157)  diphenhydrAMINE (BENADRYL) injection 12.5 mg (12.5 mg Intravenous Given 02/18/24 2155)  dexamethasone (DECADRON) injection 10 mg (10 mg Intravenous Given 02/18/24 2156)  sodium chloride 0.9 % bolus 1,000 mL (1,000 mLs Intravenous New Bag/Given 02/18/24 2155)   43 here for vaginal allergic reaction and headache.  Stung by bees in her left lip about 30 minutes PTA.  She also notes she has a migraine, history of  similar.  Feels similar.  No recent head trauma.  She has a nonfocal neuroexam.  She is afebrile, nonseptic, non-ill-appearing.  Freely moves her neck without difficulty low suspicion for meningitis.  Will give migraine cocktail.  Will present steroids and the migraine cocktail which will help with her swelling as well as Benadryl from her allergic reaction.  Low suspicion for angioedema, anaphylaxis as cause of her left lip swelling  Patient reassessed.  She states her headache has not improved after migraine cocktail.  I recommended  additional medications however patient declines.  She would like to go home, sleep and take her triptan.  We discussed risk versus benefit.  Does not want additional medications at this time.  I encouraged husband and to bring her back if her symptoms worsen or they do not improve with her medications at home given she wants to go home at this point.  She will return for any worsening symptoms.  At this time I have low suspicion for [facility], IIH, thrombosis, CVA, dissection, mass, edema, acute angle glaucoma, anaphylaxis, trigeminal neuralgia, giant cell arteritis.  The patient has been appropriately medically screened and/or stabilized in the ED. I have low suspicion for any other emergent medical condition which would require further screening, evaluation or treatment in the ED or require inpatient management.  Patient is hemodynamically stable and in no acute distress.  Patient able to ambulate in department prior to ED.  Evaluation does not show acute pathology that would require ongoing or additional emergent interventions while in the emergency department or further inpatient treatment.  I have discussed the diagnosis with the patient and answered all questions.  Pain is been managed while in the emergency department and patient has no further complaints prior to discharge.  Patient is comfortable with plan discussed in room and is stable for discharge at this time.  I have  discussed strict return precautions for returning to the emergency department.  Patient was encouraged to follow-up with PCP/specialist refer to at discharge.                                    Medical Decision Making Risk Prescription drug management.        Final diagnoses:  Allergic reaction, initial encounter  Acute nonintractable headache, unspecified headache type    ED Discharge Orders     None          Amery Vandenbos A, PA-C 02/18/24 2254    Ruthe Cornet, DO 02/18/24 2257

## 2024-02-18 NOTE — Discharge Instructions (Signed)
 It was a pleasure taking care of you here in the emergency department  May take Benadryl, 25 mg every 6-8 hours as needed for swelling.  We had discussed additional IV medications to help with your headache however you declined.  Please return for any new or worsening symptoms.

## 2024-02-18 NOTE — ED Notes (Signed)
 Pt states her headache is worse after receiving medications (see MAR). EDP notified.
